# Patient Record
Sex: Female | Born: 1990 | Hispanic: No | Marital: Married | Smoking: Never smoker
Health system: Southern US, Community
[De-identification: ages and names within clinical notes are randomized; demographics above are authoritative.]

## PROBLEM LIST (undated history)

## (undated) DIAGNOSIS — Z227 Latent tuberculosis: Secondary | ICD-10-CM

## (undated) HISTORY — PX: PARTIAL HYMENECTOMY: SHX327

---
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
## 2019-04-10 NOTE — L&D Delivery Note (Signed)
Delivery Note At 3:52 AM a viable female was delivered via Vaginal, Spontaneous (Presentation: Right Occiput Anterior).  APGAR: 9, 10; weight pending.   Placenta status: Spontaneous, Intact.  Cord: 3 vessels with the following complications: None.  Cord pH: n/a  Anesthesia: Epidural Episiotomy: None Lacerations: 2nd degree;Perineal Suture Repair: 2.0 3.0 vicryl Est. Blood Loss (mL): 450  Mom to postpartum.  Baby to Couplet care / Skin to Skin.  Called to see patient.  Mom pushed to deliver a viable female infant.  The head followed by shoulders, which delivered without difficulty, and the rest of the body.  No nuchal cord noted.  Baby to mom's chest.  Cord clamped and cut after > 1 min delay.  No cord blood obtained.  Placenta delivered spontaneously, intact, with a 3-vessel cord.  Second degree perineal laceration repaired with 3-0 and 2-0 Vicryl in standard fashion.  All counts correct.  Hemostasis obtained with IV pitocin and fundal massage. EBL 450 mL.     Thomasene Mohair, MD 04/06/2020, 4:29 AM

## 2019-08-08 DIAGNOSIS — Z227 Latent tuberculosis: Secondary | ICD-10-CM

## 2019-08-08 HISTORY — DX: Latent tuberculosis: Z22.7

## 2019-10-07 LAB — OB RESULTS CONSOLE HIV ANTIBODY (ROUTINE TESTING): HIV: NONREACTIVE

## 2019-10-07 LAB — OB RESULTS CONSOLE RUBELLA ANTIBODY, IGM: Rubella: NON-IMMUNE/NOT IMMUNE

## 2019-10-07 LAB — OB RESULTS CONSOLE RPR: RPR: NONREACTIVE

## 2019-10-07 LAB — OB RESULTS CONSOLE HEPATITIS B SURFACE ANTIGEN: Hepatitis B Surface Ag: NEGATIVE

## 2020-01-29 ENCOUNTER — Observation Stay
Admission: EM | Admit: 2020-01-29 | Discharge: 2020-01-29 | Disposition: A | Discharge status: Home / Self Care | Payer: 59 | Attending: Obstetrics and Gynecology | Admitting: Obstetrics and Gynecology

## 2020-01-29 ENCOUNTER — Other Ambulatory Visit: Payer: Self-pay

## 2020-01-29 ENCOUNTER — Encounter: Payer: Self-pay | Admitting: *Deleted

## 2020-01-29 DIAGNOSIS — O418X10 Other specified disorders of amniotic fluid and membranes, first trimester, not applicable or unspecified: Secondary | ICD-10-CM | POA: Diagnosis not present

## 2020-01-29 DIAGNOSIS — Z3A28 28 weeks gestation of pregnancy: Secondary | ICD-10-CM

## 2020-01-29 DIAGNOSIS — Z3403 Encounter for supervision of normal first pregnancy, third trimester: Secondary | ICD-10-CM

## 2020-01-29 DIAGNOSIS — O99891 Other specified diseases and conditions complicating pregnancy: Secondary | ICD-10-CM | POA: Diagnosis not present

## 2020-01-29 DIAGNOSIS — Z227 Latent tuberculosis: Secondary | ICD-10-CM | POA: Diagnosis not present

## 2020-01-29 DIAGNOSIS — O4193X0 Disorder of amniotic fluid and membranes, unspecified, third trimester, not applicable or unspecified: Secondary | ICD-10-CM | POA: Diagnosis present

## 2020-01-29 LAB — RUPTURE OF MEMBRANE (ROM)PLUS: Rom Plus: NEGATIVE

## 2020-01-29 NOTE — Final Progress Note (Signed)
Physician Final Progress Note  Patient ID: Patricia Ashley MRN: [id number] DOB/AGE: 29/01/1991 29 y.o.  Admit date: 01/29/2020 Admitting provider: [name], CNM Discharge date: 01/29/2020   Admission Diagnoses:  1) intrauterine pregnancy at 103w5d  2) concern for ruptured membranes  Discharge Diagnoses:  1) intrauterine pregnancy at [redacted]w[redacted]d  2) concern for ruptured membranes - no evidence of ruptured membranes   History of Present Illness: The patient is a 29 y.o. female G1P0 at [redacted]w[redacted]d by LMP who presents for concern for rupture of membranes.  She had a big gush of fluid two midnights ago.  She has had no fluid leaking since then.  She denies any other vaginal symptoms, including; itching, burning, and irritation.  She notes +FM, no vaginal bleeding and no contractions.  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 She has had her prenatal care in [place].  She was seen by infectious disease who planned to treat her postpartum. She had negative chest x-ray.  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  She states that she is healing well from this.  She is Rh+, she passed her 1 hour glucose test: 125.  She had first trimester screening which was negative.  She is rubella non-immune.   Past Medical History:  Diagnosis Date  . Latent tuberculosis     Past Surgical History:  Procedure Laterality Date  . PARTIAL HYMENECTOMY      No current facility-administered medications on file prior to encounter.   Current Outpatient Medications on File Prior to Encounter  Medication Sig Dispense Refill  . Prenatal Vit-Fe Fumarate-FA (MULTIVITAMIN-PRENATAL) 27-0.8 MG TABS tablet Take 1 tablet by mouth daily at 12 noon.      No Known Allergies  Social History   Socioeconomic History  . Marital status: Married    Spouse name: Not on file  . Number of children: Not on file  . Years of education: Not on file  . Highest education  level: Not on file  Occupational History  . Not on file  Tobacco Use  . Smoking status: Never Smoker  . Smokeless tobacco: Never Used  Substance and Sexual Activity  . Alcohol use: Never  . Drug use: Never  . Sexual activity: Never  Other Topics Concern  . Not on file  Social History Narrative  . Not on file   Social Determinants of Health   Financial Resource Strain:   . Difficulty of Paying Living Expenses: Not on file  Food Insecurity:   . Worried About Programme researcher, broadcasting/film/video in the Last Year: Not on file  . Ran Out of Food in the Last Year: Not on file  Transportation Needs:   . Lack of Transportation (Medical): Not on file  . Lack of Transportation (Non-Medical): Not on file  Physical Activity:   . Days of Exercise per Week: Not on file  . Minutes of Exercise per Session: Not on file  Stress:   . Feeling of Stress : Not on file  Social Connections:   . Frequency of Communication with Friends and Family: Not on file  . Frequency of Social Gatherings with Friends and Family: Not on file  . Attends Religious Services: Not on file  . Active Member of Clubs or Organizations: Not on file  . Attends Banker Meetings: Not on file  . Marital Status: Not on file  Intimate Partner Violence:   . Fear of Current or Ex-Partner: Not on file  .  Emotionally Abused: Not on file  . Physically Abused: Not on file  . Sexually Abused: Not on file    Family History  Problem Relation Age of Onset  . Congestive Heart Failure Mother   . Diabetes Mother   . Hypertension Mother   . Diabetes Father   . Hypertension Father      Review of Systems  Constitutional: Negative.   HENT: Negative.   Eyes: Negative.   Respiratory: Negative.   Cardiovascular: Negative.   Gastrointestinal: Negative.   Genitourinary: Negative.   Musculoskeletal: Negative.   Skin: Negative.   Neurological: Negative.   Psychiatric/Behavioral: Negative.      Physical Exam: BP 111/68 (BP  Location: Left Arm)   Pulse (!) 108   Temp 98.1 F (36.7 C) (Oral)   Resp 18   LMP 07/12/2019   Physical Exam Constitutional:      General: She is not in acute distress.    Appearance: Normal appearance. She is well-developed.  Genitourinary:     Genitourinary Comments: deferred  HENT:     Head: Normocephalic and atraumatic.  Eyes:     General: No scleral icterus.    Conjunctiva/sclera: Conjunctivae normal.  Cardiovascular:     Rate and Rhythm: Normal rate and regular rhythm.     Heart sounds: No murmur heard.  No friction rub. No gallop.   Pulmonary:     Effort: Pulmonary effort is normal. No respiratory distress.     Breath sounds: Normal breath sounds. No wheezing or rales.  Abdominal:     General: Bowel sounds are normal. There is no distension.     Palpations: Abdomen is soft. There is mass (Gravid, NT).     Tenderness: There is no abdominal tenderness. There is no guarding or rebound.  Musculoskeletal:        General: Normal range of motion.     Cervical back: Normal range of motion and neck supple.  Neurological:     General: No focal deficit present.     Mental Status: She is alert and oriented to person, place, and time.     Cranial Nerves: No cranial nerve deficit.  Skin:    General: Skin is warm and dry.     Findings: No erythema.  Psychiatric:        Mood and Affect: Mood normal.        Behavior: Behavior normal.        Judgment: Judgment normal.     Consults: None  Significant Findings/ Diagnostic Studies:  ROM+: NEGATIVE  Procedures:  NST: Baseline FHR: 145 beats/min Variability: moderate Accelerations: present Decelerations: absent Tocometry: rare/absent  Interpretation:  INDICATIONS: rule out uterine contractions RESULTS:  A NST procedure was performed with FHR monitoring and a normal baseline established, appropriate time of 20-40 minutes of evaluation, and accels >2 seen w 15x15 characteristics.  Results show a REACTIVE NST.    Hospital  Course: The patient was admitted to Labor and Delivery Triage for observation. She had normal vital signs. She had a reassuring fetal tracing, given gestational age.  She had a negative ROM+ test and a bedside u/s showed normal amniotic fluid with a singleton gestation, movement present, in breech presentation with a variable lie.  Her history of leaking fluid was discussed with her and she was reassured. She had no further concerns. So, she was discharged in stable condition.    Discharge Condition: stable  Disposition: Discharge disposition: 01-Home or Self Care       Diet: Regular  diet  Discharge Activity: Activity as tolerated   Allergies as of 01/29/2020   No Known Allergies     Medication List    TAKE these medications   multivitamin-prenatal 27-0.8 MG Tabs tablet Take 1 tablet by mouth daily at 12 noon.       Follow-up Information    [facility] Follow up.   Contact information: [address] [place] [id number] [phone]              Total time spent taking care of this patient: 45 minutes, reviewing outside pregnancy records, direct patient care.   Signed: Thomasene Mohair, MD  01/29/2020, 6:01 PM

## 2020-01-29 NOTE — OB Triage Note (Signed)
Patient arrived to ER for questionable ROM. Pt stats she was was on the way to the bathroom to void two 0000 ago and leaked clear fluid that did not smell like urn. Pt had not had any further leaking since. Pt states baby is moving well, denies pain, contractions, bleeding or any other concerns. [place] and had appointment scheduled for next Friday with [facility].

## 2020-01-29 NOTE — Progress Notes (Signed)
Patient given discharge instructions by RN and MD. Reviewed labor precautions with patient and patient and support person verbalized understanding. Patient discharged home with support person in stable condition.

## 2020-01-29 NOTE — Discharge Summary (Signed)
See Final Progress Note 

## 2020-01-29 NOTE — Discharge Instructions (Signed)
Drink plenty of fluid. Call your provider for any other concerns. Keep your scheduled appointment for Prenatal care.

## 2020-02-05 ENCOUNTER — Ambulatory Visit (INDEPENDENT_AMBULATORY_CARE_PROVIDER_SITE_OTHER): Payer: 59 | Admitting: Obstetrics & Gynecology

## 2020-02-05 ENCOUNTER — Other Ambulatory Visit: Payer: Self-pay

## 2020-02-05 ENCOUNTER — Encounter: Payer: Self-pay | Admitting: Obstetrics & Gynecology

## 2020-02-05 VITALS — BP 120/80 | Ht 61.0 in | Wt 170.0 lb

## 2020-02-05 DIAGNOSIS — O99213 Obesity complicating pregnancy, third trimester: Secondary | ICD-10-CM

## 2020-02-05 DIAGNOSIS — Z3A29 29 weeks gestation of pregnancy: Secondary | ICD-10-CM

## 2020-02-05 DIAGNOSIS — O0990 Supervision of high risk pregnancy, unspecified, unspecified trimester: Secondary | ICD-10-CM | POA: Insufficient documentation

## 2020-02-05 DIAGNOSIS — Z3493 Encounter for supervision of normal pregnancy, unspecified, third trimester: Secondary | ICD-10-CM

## 2020-02-05 LAB — POCT URINALYSIS DIPSTICK OB
Glucose, UA: NEGATIVE
POC,PROTEIN,UA: NEGATIVE

## 2020-02-05 NOTE — Addendum Note (Signed)
Addended by: Cornelius Moras D on: 02/05/2020 09:24 AM   Modules accepted: Orders

## 2020-02-05 NOTE — Patient Instructions (Signed)
Thank you for choosing [facility]. As part of our ongoing efforts to improve patient experience, we would appreciate your feedback. Please fill out the short survey that you will receive by mail or MyChart. Your opinion is important to us! -Dr Mayerly Kaman   Third Trimester of Pregnancy The third trimester is from week 28 through week 40 (months 7 through 9). The third trimester is a time when the unborn baby (fetus) is growing rapidly. At the end of the ninth month, the fetus is about 20 inches in length and weighs 6-10 pounds. Body changes during your third trimester Your body will continue to go through many changes during pregnancy. The changes vary from woman to woman. During the third trimester:  Your weight will continue to increase. You can expect to gain 25-35 pounds (11-16 kg) by the end of the pregnancy.  You may begin to get stretch marks on your hips, abdomen, and breasts.  You may urinate more often because the fetus is moving lower into your pelvis and pressing on your bladder.  You may develop or continue to have heartburn. This is caused by increased hormones that slow down muscles in the digestive tract.  You may develop or continue to have constipation because increased hormones slow digestion and cause the muscles that push waste through your intestines to relax.  You may develop hemorrhoids. These are swollen veins (varicose veins) in the rectum that can itch or be painful.  You may develop swollen, bulging veins (varicose veins) in your legs.  You may have increased body aches in the pelvis, back, or thighs. This is due to weight gain and increased hormones that are relaxing your joints.  You may have changes in your hair. These can include thickening of your hair, rapid growth, and changes in texture. Some women also have hair loss during or after pregnancy, or hair that feels dry or thin. Your hair will most likely return to normal after your baby is born.  Your  breasts will continue to grow and they will continue to become tender. A yellow fluid (colostrum) may leak from your breasts. This is the first milk you are producing for your baby.  Your belly button may stick out.  You may notice more swelling in your hands, face, or ankles.  You may have increased tingling or numbness in your hands, arms, and legs. The skin on your belly may also feel numb.  You may feel short of breath because of your expanding uterus.  You may have more problems sleeping. This can be caused by the size of your belly, increased need to urinate, and an increase in your body's metabolism.  You may notice the fetus "dropping," or moving lower in your abdomen (lightening).  You may have increased vaginal discharge.  You may notice your joints feel loose and you may have pain around your pelvic bone. What to expect at prenatal visits You will have prenatal exams every 2 weeks until week 36. Then you will have weekly prenatal exams. During a routine prenatal visit:  You will be weighed to make sure you and the baby are growing normally.  Your blood pressure will be taken.  Your abdomen will be measured to track your baby's growth.  The fetal heartbeat will be listened to.  Any test results from the previous visit will be discussed.  You may have a cervical check near your due date to see if your cervix has softened or thinned (effaced).  You will be   tested for Group B streptococcus. This happens between 35 and 37 weeks. Your health care provider may ask you:  What your birth plan is.  How you are feeling.  If you are feeling the baby move.  If you have had any abnormal symptoms, such as leaking fluid, bleeding, severe headaches, or abdominal cramping.  If you are using any tobacco products, including cigarettes, chewing tobacco, and electronic cigarettes.  If you have any questions. Other tests or screenings that may be performed during your third  trimester include:  Blood tests that check for low iron levels (anemia).  Fetal testing to check the health, activity level, and growth of the fetus. Testing is done if you have certain medical conditions or if there are problems during the pregnancy.  Nonstress test (NST). This test checks the health of your baby to make sure there are no signs of problems, such as the baby not getting enough oxygen. During this test, a belt is placed around your belly. The baby is made to move, and its heart rate is monitored during movement. What is false labor? False labor is a condition in which you feel small, irregular tightenings of the muscles in the womb (contractions) that usually go away with rest, changing position, or drinking water. These are called Braxton Hicks contractions. Contractions may last for hours, days, or even weeks before true labor sets in. If contractions come at regular intervals, become more frequent, increase in intensity, or become painful, you should see your health care provider. What are the signs of labor?  Abdominal cramps.  Regular contractions that start at 10 minutes apart and become stronger and more frequent with time.  Contractions that start on the top of the uterus and spread down to the lower abdomen and back.  Increased pelvic pressure and dull back pain.  A watery or bloody mucus discharge that comes from the vagina.  Leaking of amniotic fluid. This is also known as your "water breaking." It could be a slow trickle or a gush. Let your health care provider know if it has a color or strange odor. If you have any of these signs, call your health care provider right away, even if it is before your due date. Follow these instructions at home: Medicines  Follow your health care provider's instructions regarding medicine use. Specific medicines may be either safe or unsafe to take during pregnancy.  Take a prenatal vitamin that contains at least 600 micrograms  (mcg) of folic acid.  If you develop constipation, try taking a stool softener if your health care provider approves. Eating and drinking   Eat a balanced diet that includes fresh fruits and vegetables, whole grains, good sources of protein such as meat, eggs, or tofu, and low-fat dairy. Your health care provider will help you determine the amount of weight gain that is right for you.  Avoid raw meat and uncooked cheese. These carry germs that can cause birth defects in the baby.  If you have low calcium intake from food, talk to your health care provider about whether you should take a daily calcium supplement.  Eat four or five small meals rather than three large meals a day.  Limit foods that are high in fat and processed sugars, such as fried and sweet foods.  To prevent constipation: ? Drink enough fluid to keep your urine clear or pale yellow. ? Eat foods that are high in fiber, such as fresh fruits and vegetables, whole grains, and beans. Activity    Exercise only as directed by your health care provider. Most women can continue their usual exercise routine during pregnancy. Try to exercise for 30 minutes at least 5 days a week. Stop exercising if you experience uterine contractions.  Avoid heavy lifting.  Do not exercise in extreme heat or humidity, or at high altitudes.  Wear low-heel, comfortable shoes.  Practice good posture.  You may continue to have sex unless your health care provider tells you otherwise. Relieving pain and discomfort  Take frequent breaks and rest with your legs elevated if you have leg cramps or low back pain.  Take warm sitz baths to soothe any pain or discomfort caused by hemorrhoids. Use hemorrhoid cream if your health care provider approves.  Wear a good support bra to prevent discomfort from breast tenderness.  If you develop varicose veins: ? Wear support pantyhose or compression stockings as told by your healthcare provider. ? Elevate  your feet for 15 minutes, 3-4 times a day. Prenatal care  Write down your questions. Take them to your prenatal visits.  Keep all your prenatal visits as told by your health care provider. This is important. Safety  Wear your seat belt at all times when driving.  Make a list of emergency phone numbers, including numbers for family, friends, the hospital, and police and fire departments. General instructions  Avoid cat litter boxes and soil used by cats. These carry germs that can cause birth defects in the baby. If you have a cat, ask someone to clean the litter box for you.  Do not travel far distances unless it is absolutely necessary and only with the approval of your health care provider.  Do not use hot tubs, steam rooms, or saunas.  Do not drink alcohol.  Do not use any products that contain nicotine or tobacco, such as cigarettes and e-cigarettes. If you need help quitting, ask your health care provider.  Do not use any medicinal herbs or unprescribed drugs. These chemicals affect the formation and growth of the baby.  Do not douche or use tampons or scented sanitary pads.  Do not cross your legs for long periods of time.  To prepare for the arrival of your baby: ? Take prenatal classes to understand, practice, and ask questions about labor and delivery. ? Make a trial run to the hospital. ? Visit the hospital and tour the maternity area. ? Arrange for maternity or paternity leave through employers. ? Arrange for family and friends to take care of pets while you are in the hospital. ? Purchase a rear-facing car seat and make sure you know how to install it in your car. ? Pack your hospital bag. ? Prepare the baby's nursery. Make sure to remove all pillows and stuffed animals from the baby's crib to prevent suffocation.  Visit your dentist if you have not gone during your pregnancy. Use a soft toothbrush to brush your teeth and be gentle when you floss. Contact a health  care provider if:  You are unsure if you are in labor or if your water has broken.  You become dizzy.  You have mild pelvic cramps, pelvic pressure, or nagging pain in your abdominal area.  You have lower back pain.  You have persistent nausea, vomiting, or diarrhea.  You have an unusual or bad smelling vaginal discharge.  You have pain when you urinate. Get help right away if:  Your water breaks before 37 weeks.  You have regular contractions less than 5 minutes apart before   37 weeks.  You have a fever.  You are leaking fluid from your vagina.  You have spotting or bleeding from your vagina.  You have severe abdominal pain or cramping.  You have rapid weight loss or weight gain.  You have shortness of breath with chest pain.  You notice sudden or extreme swelling of your face, hands, ankles, feet, or legs.  Your baby makes fewer than 10 movements in 2 hours.  You have severe headaches that do not go away when you take medicine.  You have vision changes. Summary  The third trimester is from week 28 through week 40, months 7 through 9. The third trimester is a time when the unborn baby (fetus) is growing rapidly.  During the third trimester, your discomfort may increase as you and your baby continue to gain weight. You may have abdominal, leg, and back pain, sleeping problems, and an increased need to urinate.  During the third trimester your breasts will keep growing and they will continue to become tender. A yellow fluid (colostrum) may leak from your breasts. This is the first milk you are producing for your baby.  False labor is a condition in which you feel small, irregular tightenings of the muscles in the womb (contractions) that eventually go away. These are called Braxton Hicks contractions. Contractions may last for hours, days, or even weeks before true labor sets in.  Signs of labor can include: abdominal cramps; regular contractions that start at 10  minutes apart and become stronger and more frequent with time; watery or bloody mucus discharge that comes from the vagina; increased pelvic pressure and dull back pain; and leaking of amniotic fluid. This information is not intended to replace advice given to you by your health care provider. Make sure you discuss any questions you have with your health care provider. Document Revised: 07/17/2018 Document Reviewed: 05/01/2016 [employer] Patient Education  2020 [employer]  

## 2020-02-05 NOTE — Progress Notes (Signed)
02/05/2020   Chief Complaint: Missed period  Transfer of Care Patient: YES, from [place], records reviewed  History of Present Illness: Ms. Manke is a 29 y.o. G1P0 [redacted]w[redacted]d based on Patient's last menstrual period was 07/12/2019. with an Estimated Date of Delivery: 04/17/20, with the above CC.   She has received prenatal care, with obesity (BMI 30-35) as a risk factor as well as hymenectomy 2 mos ago in preparation.  Normal labs and [place] reviewed.  ROS: A 12-point review of systems was performed and negative, except as stated in the above HPI.  OBGYN History: As per HPI. OB History  Gravida Para Term Preterm AB Living  1            SAB TAB Ectopic Multiple Live Births               # Outcome Date GA Lbr Len/2nd Weight Sex Delivery Anes PTL Lv  1 Current             Any issues with any prior pregnancies: not applicable Any prior children are healthy, doing well, without any problems or issues: not applicable History of pap smears: Yes. Last pap smear Unsure, not in records. History of STIs: No   Past Medical History: Past Medical History:  Diagnosis Date  . Latent tuberculosis     Past Surgical History: Past Surgical History:  Procedure Laterality Date  . PARTIAL HYMENECTOMY      Family History:  Family History  Problem Relation Age of Onset  . Congestive Heart Failure Mother   . Diabetes Mother   . Hypertension Mother   . Diabetes Father   . Hypertension Father    She denies any female cancers, bleeding or blood clotting disorders.  She denies any history of mental retardation, birth defects or genetic disorders in her or the FOB's history  Social History:  Social History   Socioeconomic History  . Marital status: Married    Spouse name: Not on file  . Number of children: Not on file  . Years of education: Not on file  . Highest education level: Not on file  Occupational History  . Not on file  Tobacco Use  . Smoking status: Never Smoker  . Smokeless  tobacco: Never Used  Vaping Use  . Vaping Use: Never used  Substance and Sexual Activity  . Alcohol use: Never  . Drug use: Never  . Sexual activity: Never  Other Topics Concern  . Not on file  Social History Narrative  . Not on file   Social Determinants of Health   Financial Resource Strain:   . Difficulty of Paying Living Expenses: Not on file  Food Insecurity:   . Worried About Programme researcher, broadcasting/film/video in the Last Year: Not on file  . Ran Out of Food in the Last Year: Not on file  Transportation Needs:   . Lack of Transportation (Medical): Not on file  . Lack of Transportation (Non-Medical): Not on file  Physical Activity:   . Days of Exercise per Week: Not on file  . Minutes of Exercise per Session: Not on file  Stress:   . Feeling of Stress : Not on file  Social Connections:   . Frequency of Communication with Friends and Family: Not on file  . Frequency of Social Gatherings with Friends and Family: Not on file  . Attends Religious Services: Not on file  . Active Member of Clubs or Organizations: Not on file  . Attends Club or  Organization Meetings: Not on file  . Marital Status: Not on file  Intimate Partner Violence:   . Fear of Current or Ex-Partner: Not on file  . Emotionally Abused: Not on file  . Physically Abused: Not on file  . Sexually Abused: Not on file   Any pets in the household: no   Allergy: No Known Allergies  Current Outpatient Medications:  Current Outpatient Medications:  .  Prenatal Vit-Fe Fumarate-FA (MULTIVITAMIN-PRENATAL) 27-0.8 MG TABS tablet, Take 1 tablet by mouth daily at 12 noon., Disp: , Rfl:    Physical Exam:   BP 120/80   Ht 5\' 1"  (1.549 m)   Wt 170 lb (77.1 kg)   LMP 07/12/2019   BMI 32.12 kg/m  Body mass index is 32.12 kg/m. Constitutional: Well nourished, well developed female in no acute distress.  Neck:  Supple, normal appearance, and no thyromegaly  Cardiovascular: S1, S2 normal, no murmur, rub or gallop, regular  rate and rhythm Respiratory:  Clear to auscultation bilateral. Normal respiratory effort Abdomen: positive bowel sounds and no masses, hernias; diffusely non tender to palpation, non distended FHT 140s FH 29 cm Neuro/Psych:  Normal mood and affect.  Skin:  Warm and dry.  Lymphatic:  No inguinal lymphadenopathy.   Assessment: Ms. Mccarey is a 29 y.o. G1P0 [redacted]w[redacted]d based on Patient's last menstrual period was 07/12/2019. with an Estimated Date of Delivery: 04/17/20,  for prenatal care.  Plan:  1) Avoid alcoholic beverages. 2) Patient encouraged not to smoke.  3) Discontinue the use of all non-medicinal drugs and chemicals.  4) Take prenatal vitamins daily.  5) Seatbelt use advised 6) Nutrition, food safety (fish, cheese advisories, and high nitrite foods) and exercise discussed. 7) Hospital and practice style delivering at [facility] discussed  8) Patient is asked about travel to areas at risk for the Zika virus, and counseled to avoid travel and exposure to mosquitoes or sexual partners who may have themselves been exposed to the virus. Testing is discussed, and will be ordered as appropriate.  9) Childbirth classes at [facility] advised 10) [facility] for growth nv 11) No varicella in labs, plan to check next blood draw, perhaps on L&D when laboring 12) No PAP in records, plan PP 13) Third trimester and L&D expectations d/w pt.  Problem list reviewed and updated.  [place], MD, [facility], [facility] 02/05/2020  9:10 AM

## 2020-02-19 ENCOUNTER — Ambulatory Visit: Payer: 59

## 2020-02-19 ENCOUNTER — Encounter: Payer: 59 | Admitting: Obstetrics and Gynecology

## 2020-02-24 ENCOUNTER — Ambulatory Visit (INDEPENDENT_AMBULATORY_CARE_PROVIDER_SITE_OTHER): Payer: 59 | Admitting: Obstetrics and Gynecology

## 2020-02-24 ENCOUNTER — Ambulatory Visit: Payer: 59

## 2020-02-24 ENCOUNTER — Other Ambulatory Visit: Payer: Self-pay

## 2020-02-24 ENCOUNTER — Encounter: Payer: Self-pay | Admitting: Obstetrics and Gynecology

## 2020-02-24 VITALS — BP 110/70 | Wt 173.0 lb

## 2020-02-24 DIAGNOSIS — O0990 Supervision of high risk pregnancy, unspecified, unspecified trimester: Secondary | ICD-10-CM

## 2020-02-24 DIAGNOSIS — Z23 Encounter for immunization: Secondary | ICD-10-CM

## 2020-02-24 DIAGNOSIS — O26843 Uterine size-date discrepancy, third trimester: Secondary | ICD-10-CM

## 2020-02-24 DIAGNOSIS — Z227 Latent tuberculosis: Secondary | ICD-10-CM

## 2020-02-24 DIAGNOSIS — O99213 Obesity complicating pregnancy, third trimester: Secondary | ICD-10-CM

## 2020-02-24 DIAGNOSIS — Z3493 Encounter for supervision of normal pregnancy, unspecified, third trimester: Secondary | ICD-10-CM

## 2020-02-24 LAB — POCT URINALYSIS DIPSTICK OB
Glucose, UA: NEGATIVE
POC,PROTEIN,UA: NEGATIVE

## 2020-02-24 NOTE — Progress Notes (Signed)
Pt states for [redacted]w[redacted]d ROB visit. Pt want to know when she'll be getting an [place]. Pt states she has a cough that she has been dealing with for a month or so. Pt states she's taking Guefensin 200mg  ([employer] ) Brand.

## 2020-02-24 NOTE — Patient Instructions (Addendum)
Third Trimester of Pregnancy The third trimester is from week 28 through week 40 (months 7 through 9). The third trimester is a time when the unborn baby (fetus) is growing rapidly. At the end of the ninth month, the fetus is about 20 inches in length and weighs 6-10 pounds. Body changes during your third trimester Your body will continue to go through many changes during pregnancy. The changes vary from woman to woman. During the third trimester:  Your weight will continue to increase. You can expect to gain 25-35 pounds (11-16 kg) by the end of the pregnancy.  You may begin to get stretch marks on your hips, abdomen, and breasts.  You may urinate more often because the fetus is moving lower into your pelvis and pressing on your bladder.  You may develop or continue to have heartburn. This is caused by increased hormones that slow down muscles in the digestive tract.  You may develop or continue to have constipation because increased hormones slow digestion and cause the muscles that push waste through your intestines to relax.  You may develop hemorrhoids. These are swollen veins (varicose veins) in the rectum that can itch or be painful.  You may develop swollen, bulging veins (varicose veins) in your legs.  You may have increased body aches in the pelvis, back, or thighs. This is due to weight gain and increased hormones that are relaxing your joints.  You may have changes in your hair. These can include thickening of your hair, rapid growth, and changes in texture. Some women also have hair loss during or after pregnancy, or hair that feels dry or thin. Your hair will most likely return to normal after your baby is born.  Your breasts will continue to grow and they will continue to become tender. A yellow fluid (colostrum) may leak from your breasts. This is the first milk you are producing for your baby.  Your belly button may stick out.  You may notice more swelling in your hands,  face, or ankles.  You may have increased tingling or numbness in your hands, arms, and legs. The skin on your belly may also feel numb.  You may feel short of breath because of your expanding uterus.  You may have more problems sleeping. This can be caused by the size of your belly, increased need to urinate, and an increase in your body's metabolism.  You may notice the fetus "dropping," or moving lower in your abdomen (lightening).  You may have increased vaginal discharge.  You may notice your joints feel loose and you may have pain around your pelvic bone. What to expect at prenatal visits You will have prenatal exams every 2 weeks until week 36. Then you will have weekly prenatal exams. During a routine prenatal visit:  You will be weighed to make sure you and the baby are growing normally.  Your blood pressure will be taken.  Your abdomen will be measured to track your baby's growth.  The fetal heartbeat will be listened to.  Any test results from the previous visit will be discussed.  You may have a cervical check near your due date to see if your cervix has softened or thinned (effaced).  You will be tested for Group B streptococcus. This happens between 35 and 37 weeks. Your health care provider may ask you:  What your birth plan is.  How you are feeling.  If you are feeling the baby move.  If you have had any abnormal   symptoms, such as leaking fluid, bleeding, severe headaches, or abdominal cramping.  If you are using any tobacco products, including cigarettes, chewing tobacco, and electronic cigarettes.  If you have any questions. Other tests or screenings that may be performed during your third trimester include:  Blood tests that check for low iron levels (anemia).  Fetal testing to check the health, activity level, and growth of the fetus. Testing is done if you have certain medical conditions or if there are problems during the pregnancy.  Nonstress test  (NST). This test checks the health of your baby to make sure there are no signs of problems, such as the baby not getting enough oxygen. During this test, a belt is placed around your belly. The baby is made to move, and its heart rate is monitored during movement. What is false labor? False labor is a condition in which you feel small, irregular tightenings of the muscles in the womb (contractions) that usually go away with rest, changing position, or drinking water. These are called Braxton Hicks contractions. Contractions may last for hours, days, or even weeks before true labor sets in. If contractions come at regular intervals, become more frequent, increase in intensity, or become painful, you should see your health care provider. What are the signs of labor?  Abdominal cramps.  Regular contractions that start at 10 minutes apart and become stronger and more frequent with time.  Contractions that start on the top of the uterus and spread down to the lower abdomen and back.  Increased pelvic pressure and dull back pain.  A watery or bloody mucus discharge that comes from the vagina.  Leaking of amniotic fluid. This is also known as your "water breaking." It could be a slow trickle or a gush. Let your health care provider know if it has a color or strange odor. If you have any of these signs, call your health care provider right away, even if it is before your due date. Follow these instructions at home: Medicines  Follow your health care provider's instructions regarding medicine use. Specific medicines may be either safe or unsafe to take during pregnancy.  Take a prenatal vitamin that contains at least 600 micrograms (mcg) of folic acid.  If you develop constipation, try taking a stool softener if your health care provider approves. Eating and drinking   Eat a balanced diet that includes fresh fruits and vegetables, whole grains, good sources of protein such as meat, eggs, or tofu,  and low-fat dairy. Your health care provider will help you determine the amount of weight gain that is right for you.  Avoid raw meat and uncooked cheese. These carry germs that can cause birth defects in the baby.  If you have low calcium intake from food, talk to your health care provider about whether you should take a daily calcium supplement.  Eat four or five small meals rather than three large meals a day.  Limit foods that are high in fat and processed sugars, such as fried and sweet foods.  To prevent constipation: ? Drink enough fluid to keep your urine clear or pale yellow. ? Eat foods that are high in fiber, such as fresh fruits and vegetables, whole grains, and beans. Activity  Exercise only as directed by your health care provider. Most women can continue their usual exercise routine during pregnancy. Try to exercise for 30 minutes at least 5 days a week. Stop exercising if you experience uterine contractions.  Avoid heavy lifting.  Do   not exercise in extreme heat or humidity, or at high altitudes.  Wear low-heel, comfortable shoes.  Practice good posture.  You may continue to have sex unless your health care provider tells you otherwise. Relieving pain and discomfort  Take frequent breaks and rest with your legs elevated if you have leg cramps or low back pain.  Take warm sitz baths to soothe any pain or discomfort caused by hemorrhoids. Use hemorrhoid cream if your health care provider approves.  Wear a good support bra to prevent discomfort from breast tenderness.  If you develop varicose veins: ? Wear support pantyhose or compression stockings as told by your healthcare provider. ? Elevate your feet for 15 minutes, 3-4 times a day. Prenatal care  Write down your questions. Take them to your prenatal visits.  Keep all your prenatal visits as told by your health care provider. This is important. Safety  Wear your seat belt at all times when driving.  Make  a list of emergency phone numbers, including numbers for family, friends, the hospital, and police and fire departments. General instructions  Avoid cat litter boxes and soil used by cats. These carry germs that can cause birth defects in the baby. If you have a cat, ask someone to clean the litter box for you.  Do not travel far distances unless it is absolutely necessary and only with the approval of your health care provider.  Do not use hot tubs, steam rooms, or saunas.  Do not drink alcohol.  Do not use any products that contain nicotine or tobacco, such as cigarettes and e-cigarettes. If you need help quitting, ask your health care provider.  Do not use any medicinal herbs or unprescribed drugs. These chemicals affect the formation and growth of the baby.  Do not douche or use tampons or scented sanitary pads.  Do not cross your legs for long periods of time.  To prepare for the arrival of your baby: ? Take prenatal classes to understand, practice, and ask questions about labor and delivery. ? Make a trial run to the hospital. ? Visit the hospital and tour the maternity area. ? Arrange for maternity or paternity leave through employers. ? Arrange for family and friends to take care of pets while you are in the hospital. ? Purchase a rear-facing car seat and make sure you know how to install it in your car. ? Pack your hospital bag. ? Prepare the baby's nursery. Make sure to remove all pillows and stuffed animals from the baby's crib to prevent suffocation.  Visit your dentist if you have not gone during your pregnancy. Use a soft toothbrush to brush your teeth and be gentle when you floss. Contact a health care provider if:  You are unsure if you are in labor or if your water has broken.  You become dizzy.  You have mild pelvic cramps, pelvic pressure, or nagging pain in your abdominal area.  You have lower back pain.  You have persistent nausea, vomiting, or  diarrhea.  You have an unusual or bad smelling vaginal discharge.  You have pain when you urinate. Get help right away if:  Your water breaks before 37 weeks.  You have regular contractions less than 5 minutes apart before 37 weeks.  You have a fever.  You are leaking fluid from your vagina.  You have spotting or bleeding from your vagina.  You have severe abdominal pain or cramping.  You have rapid weight loss or weight gain.  You have   shortness of breath with chest pain.  You notice sudden or extreme swelling of your face, hands, ankles, feet, or legs.  Your baby makes fewer than 10 movements in 2 hours.  You have severe headaches that do not go away when you take medicine.  You have vision changes. Summary  The third trimester is from week 28 through week 40, months 7 through 9. The third trimester is a time when the unborn baby (fetus) is growing rapidly.  During the third trimester, your discomfort may increase as you and your baby continue to gain weight. You may have abdominal, leg, and back pain, sleeping problems, and an increased need to urinate.  During the third trimester your breasts will keep growing and they will continue to become tender. A yellow fluid (colostrum) may leak from your breasts. This is the first milk you are producing for your baby.  False labor is a condition in which you feel small, irregular tightenings of the muscles in the womb (contractions) that eventually go away. These are called Braxton Hicks contractions. Contractions may last for hours, days, or even weeks before true labor sets in.  Signs of labor can include: abdominal cramps; regular contractions that start at 10 minutes apart and become stronger and more frequent with time; watery or bloody mucus discharge that comes from the vagina; increased pelvic pressure and dull back pain; and leaking of amniotic fluid. This information is not intended to replace advice given to you by your  health care provider. Make sure you discuss any questions you have with your health care provider. Document Revised: 07/17/2018 Document Reviewed: 05/01/2016 Elsevier Patient Education  2020 [employer]   Tuberculosis Tuberculosis (TB) is an infection that usually affects the lungs but can affect other parts of the body. It is caused by bacteria. There are two forms of TB:  Active TB, also called TB disease. This means that you have TB symptoms and your infection can spread to another person (you are contagious).  Latent TB. This means that you do not have any symptoms of TB and you are not contagious. Latent TB can turn into active TB, so it is important to get treatment no matter which form of TB you have. What are the causes? TB is caused by bacteria called Mycobacterium tuberculosis. You can catch the bacteria by breathing in droplets from a cough or sneeze from someone who has active TB. What increases the risk? You are more likely to develop TB if you:  Have HIV (human immunodeficiency virus) or AIDS (acquired immunodeficiency syndrome).  Have diabetes (diabetes mellitus).  Are older. Infants are also more likely to get TB.  Use illegal drugs.  Use tobacco.  Live in or travel to areas that have high rates of TB, such as: ? [place]. ? [place] ? [place]. ? [place] ? [place]. ? [place]. ? [place].  Live or work in areas of overcrowding or poor airflow (ventilation), including: ? Health care facilities. ? Residential care facilities, such as an assisted living facility. ? Refugee camps or shelters for people who are experiencing homelessness. What are the signs or symptoms? Symptoms of this condition include:  Coughing up blood, mucus from the lungs (sputum), or both.  A cough that lasts three weeks or longer.  Chest pain, or pain while breathing or coughing.  Unexplained weight loss.  Fatigue and weakness.  Fever.  Sweating.  Chills.  Loss of  appetite. How is this diagnosed? This condition may be diagnosed based on  a physical exam, your symptoms, and your medical history. You may have chest X-rays done. You may also have one or more of the following samples taken and tested for bacteria:  Skin.  Blood.  Sputum.  Urine. How is this treated? Both latent and active TB are treated with antibiotic medicine. You may need to take antibiotics for up to 6-9 months. Follow these instructions at home:     Medicines  Take over-the-counter and prescription medicines only as told by your health care provider.  Take your antibiotic medicine as told by your health care provider. Do not stop taking the antibiotic even if you start to feel better. Activity  Rest as needed. Ask your health care provider what activities are safe for you.  Do not go back to work or school until your health care provider approves.  Avoid close contact with others (especially infants and older people) until your health care provider says that you are no longer contagious. General instructions  Tell your health care provider about all the people you live with or have close contact with. Those people may need to be tested for TB.  Do not use any products that contain nicotine or tobacco, such as cigarettes and e-cigarettes. If you need help quitting, ask your health care provider.  Cover your mouth and nose when you cough or sneeze. Dispose of used tissues as directed by your health care provider.  Wash your hands often with soap and water. If soap and water are not available, use hand sanitizer.  Keep all follow-up visits as told by your health care provider. This is important. Contact a health care provider if:  You have new symptoms.  You lose your appetite.  You feel nauseous or you vomit.  Your urine is dark yellow.  Your skin or the white part of your eyes turns a yellowish color (jaundice).  Your symptoms get worse or do not go away with  treatment.  You have a fever. Get help right away if:  You have chest pain.  You cough up blood.  You have trouble breathing or feel short of breath.  You have a headache or neck stiffness. Summary  Tuberculosis (TB) is an infection that usually affects the lungs but can affect other parts of the body. It is caused by bacteria.  The two forms of TB are active TB and latent TB. If you have active TB, your infection can spread to another person (you are contagious).  Latent TB can turn into active TB, so it is important to get treatment no matter which form of TB you have.  TB is treated with antibiotic medicine. You may need to take antibiotics for up to 6-9 months. This information is not intended to replace advice given to you by your health care provider. Make sure you discuss any questions you have with your health care provider. Document Revised: 06/25/2017 Document Reviewed: 10/12/2016 Elsevier Patient Education  2020 [employer].

## 2020-02-24 NOTE — Progress Notes (Signed)
Routine Prenatal Care Visit  Subjective  Patricia Ashley is a 30 y.o. G1P0 at [redacted]w[redacted]d being seen today for ongoing prenatal care.  She is currently monitored for the following issues for this high-risk pregnancy and has Labor and delivery, indication for care; Encounter for supervision of normal first pregnancy in third trimester; [redacted] weeks gestation of pregnancy; Supervision of high risk pregnancy, antepartum; and Obesity affecting pregnancy in third trimester on their problem list.  ----------------------------------------------------------------------------------- Patient reports no complaints.   Contractions: Not present. Vag. Bleeding: None.  Movement: Present. Denies leaking of fluid.  ----------------------------------------------------------------------------------- The following portions of the patient's history were reviewed and updated as appropriate: allergies, current medications, past family history, past medical history, past social history, past surgical history and problem list. Problem list updated.   Objective  Blood pressure 110/70, weight 173 lb (78.5 kg), last menstrual period 07/12/2019. Pregravid weight 127 lb (57.6 kg) Total Weight Gain 46 lb (20.9 kg) Urinalysis:      Fetal Status: Fetal Heart Rate (bpm): 160 Fundal Height: 32 cm Movement: Present     General:  Alert, oriented and cooperative. Patient is in no acute distress.  Skin: Skin is warm and dry. No rash noted.   Cardiovascular: Normal heart rate noted  Respiratory: Normal respiratory effort, no problems with respiration noted  Abdomen: Soft, gravid, appropriate for gestational age. Pain/Pressure: Absent     Pelvic:  Cervical exam deferred        Extremities: Normal range of motion.  Edema: None  Mental Status: Normal mood and affect. Normal behavior. Normal judgment and thought content.     Assessment   29 y.o. G1P0 at [redacted]w[redacted]d by  04/17/2020, by Last Menstrual Period presenting for routine prenatal  visit  Plan   G1 Problems (from 01/29/20 to present)    Problem Noted Resolved   Supervision of high risk pregnancy, antepartum 02/05/2020 by Nadara Mustard, MD No   Overview Addendum 02/24/2020  2:59 PM by Natale Milch, MD    Clinic [facility] Prenatal Labs  Dating LMP Blood type:   - B+  Genetic Screen 1 Screen:  NEG Antibody: Neg  Anatomic [place] in [place], nml Rubella:   - NonImmune  Varicella:   GTT Third trimester:125  RPR:   - neg Trep test  Rhogam n/a HBsAg:   - NR  TDaP vaccine    02/24/2020           Flu Shot: 01/14/2020 HIV:   - NR  Baby Food   Breast                             GBS:   Contraception  Undecided Pap: will need postpartum  CBB  No   CS/VBAC n/a   Support Person Husband     High Risk Pregnancy Diagnoses Microperforate hymen, 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 Encouraged to maintain current weight as much as feasible.           Previous Version   Encounter for supervision of normal first pregnancy in third trimester 01/29/2020 by Conard Novak, MD No   [redacted] weeks gestation of pregnancy 01/29/2020 by Conard Novak, MD No      Will schedule [place] at the hospital because [place] tech is out sick. Order placed ID referral for history of latent TB. Patient reporting cough  for the last month, concern for transition to active TB.  Patient reports 46 lbs of weight gain this pregnancy. Encouraged to maintain current weight to minimize further pregnancy weight gain.   Gestational age appropriate obstetric precautions including but not limited to vaginal bleeding, contractions, leaking of fluid and fetal movement were reviewed in detail with the patient.    Return in about 2 weeks (around 03/09/2020) for ROB in person .  Natale Milch MD [facility], [facility] 02/24/2020, 3:09 PM

## 2020-02-26 ENCOUNTER — Other Ambulatory Visit: Payer: Self-pay | Admitting: Obstetrics and Gynecology

## 2020-02-26 DIAGNOSIS — Z3689 Encounter for other specified antenatal screening: Secondary | ICD-10-CM

## 2020-02-26 DIAGNOSIS — Z3493 Encounter for supervision of normal pregnancy, unspecified, third trimester: Secondary | ICD-10-CM

## 2020-02-29 ENCOUNTER — Ambulatory Visit
Admission: RE | Admit: 2020-02-29 | Discharge: 2020-02-29 | Disposition: A | Discharge status: Home / Self Care | Payer: 59 | Source: Ambulatory Visit | Attending: Obstetrics and Gynecology | Admitting: Obstetrics and Gynecology

## 2020-02-29 ENCOUNTER — Other Ambulatory Visit: Payer: Self-pay

## 2020-02-29 DIAGNOSIS — Z3493 Encounter for supervision of normal pregnancy, unspecified, third trimester: Secondary | ICD-10-CM | POA: Diagnosis not present

## 2020-02-29 DIAGNOSIS — Z3689 Encounter for other specified antenatal screening: Secondary | ICD-10-CM | POA: Insufficient documentation

## 2020-03-07 ENCOUNTER — Encounter: Payer: 59 | Admitting: Obstetrics and Gynecology

## 2020-03-07 ENCOUNTER — Other Ambulatory Visit: Payer: 59

## 2020-03-08 ENCOUNTER — Ambulatory Visit: Payer: 59 | Attending: Infectious Diseases | Admitting: Infectious Diseases

## 2020-03-08 ENCOUNTER — Other Ambulatory Visit: Payer: Self-pay

## 2020-03-08 ENCOUNTER — Encounter: Payer: Self-pay | Admitting: Infectious Diseases

## 2020-03-08 VITALS — BP 110/83 | HR 114 | Temp 98.3°F | Resp 16 | Ht 73.0 in | Wt 177.0 lb

## 2020-03-08 DIAGNOSIS — Z227 Latent tuberculosis: Secondary | ICD-10-CM

## 2020-03-08 DIAGNOSIS — Z3A34 34 weeks gestation of pregnancy: Secondary | ICD-10-CM | POA: Diagnosis not present

## 2020-03-08 DIAGNOSIS — Z79899 Other long term (current) drug therapy: Secondary | ICD-10-CM | POA: Insufficient documentation

## 2020-03-08 DIAGNOSIS — O26893 Other specified pregnancy related conditions, third trimester: Secondary | ICD-10-CM | POA: Insufficient documentation

## 2020-03-08 DIAGNOSIS — R059 Cough, unspecified: Secondary | ICD-10-CM | POA: Diagnosis not present

## 2020-03-08 NOTE — Progress Notes (Signed)
NAME: Patricia Ashley  DOB: 13-Nov-1990  MRN: [id number]  Date/Time: 03/08/2020 11:48 AM   Subjective:  Pt is referred to me by her Ob Dr.Schuman ? Patricia Ashley is a 29 y.o. Female from [place], been in the [place] since May 2021 [redacted] weeks pregnant is referred to me for a cough and positive quantiferon Gold History obtained from patient and her husband and records from [place], [place] reviewed. This is her first pregnancy She came to the [place] in May 2021. She was in [place] . During her first trimester she started having a cough and as part of the work up in [employer] a quantiferon gold was done on 10/19/19 which was posiitve A CXR on 10/26/19 CXR -was normal and showed no active or remote TB She was seen by ID Dr.Michael kessler 3 sputums were sent for AFB 10/29/19 10/30/19, 10/31/19  and were  neg HIV NR, RPR NR  10/07/2019 Plan was to treat her for latent TB after she delivered Meanwhile she temporarily moved to [place] to have some family help during her pregnancy. Her cough got better during the 2nd trimester only to get worse when she entered the third trimester- She says sleeping on the rt side makes her cough, eating fruits like watermelon and citrus fruis makes her cough..She has no heartburn but tastes food/liquids after a few hours She has some white sputum Cold weather makes her  cough more No fever , no night sweats, good appetite Weight gain of 20kgs during this pregnancy Has no t been treated for TB or known exposure to TB when in [place] No BCG vaccination  Past Medical History:  Diagnosis Date  . Latent tuberculosis     Past Surgical History:  Procedure Laterality Date  . PARTIAL HYMENECTOMY      Social History   Socioeconomic History  . Marital status: Married    Spouse name: Not on file  . Number of children: Not on file  . Years of education: Not on file  . Highest education level: Not on file  Occupational History  . Not on file  Tobacco Use  . Smoking status:  Never Smoker  . Smokeless tobacco: Never Used  Vaping Use  . Vaping Use: Never used  Substance and Sexual Activity  . Alcohol use: Never  . Drug use: Never  . Sexual activity: Never  Other Topics Concern  . Not on file  Social History Narrative  . Not on file   Social Determinants of Health   Financial Resource Strain:   . Difficulty of Paying Living Expenses: Not on file  Food Insecurity:   . Worried About Programme researcher, broadcasting/film/video in the Last Year: Not on file  . Ran Out of Food in the Last Year: Not on file  Transportation Needs:   . Lack of Transportation (Medical): Not on file  . Lack of Transportation (Non-Medical): Not on file  Physical Activity:   . Days of Exercise per Week: Not on file  . Minutes of Exercise per Session: Not on file  Stress:   . Feeling of Stress : Not on file  Social Connections:   . Frequency of Communication with Friends and Family: Not on file  . Frequency of Social Gatherings with Friends and Family: Not on file  . Attends Religious Services: Not on file  . Active Member of Clubs or Organizations: Not on file  . Attends Banker Meetings: Not on file  . Marital Status: Not on file  Intimate  Partner Violence:   . Fear of Current or Ex-Partner: Not on file  . Emotionally Abused: Not on file  . Physically Abused: Not on file  . Sexually Abused: Not on file    Family History  Problem Relation Age of Onset  . Congestive Heart Failure Mother   . Diabetes Mother   . Hypertension Mother   . Diabetes Father   . Hypertension Father    No Known Allergies  ? Current Outpatient Medications  Medication Sig Dispense Refill  . Dextromethorphan Polistirex (ROBITUSSIN 12 HOUR COUGH PO) Take by mouth.    . Prenatal Vit-Fe Fumarate-FA (MULTIVITAMIN-PRENATAL) 27-0.8 MG TABS tablet Take 1 tablet by mouth daily at 12 noon.     No current facility-administered medications for this visit.     Abtx:  Anti-infectives (From admission, onward)    None      REVIEW OF SYSTEMS:  Const: negative fever, negative chills, negative weight loss Eyes: negative diplopia or visual changes, negative eye pain ENT: negative coryza, negative sore throat Resp:  cough, NO hemoptysis, dyspnea Cards: negative for chest pain, palpitations, lower extremity edema GU: negative for frequency, dysuria and hematuria GI: Negative for abdominal pain, diarrhea, bleeding, constipation Skin: negative for rash and pruritus Heme: negative for easy bruising and gum/nose bleeding MS: negative for myalgias, arthralgias, back pain and muscle weakness Neurolo:negative for headaches, dizziness, vertigo, memory problems  Psych: negative for feelings of anxiety, depression  Endocrine: negative for thyroid, diabetes Allergy/Immunology- negative for any medication or food allergies ? Objective:  VITALS:  BP 110/83   Pulse (!) 114   Temp 98.3 F (36.8 C)   Resp 16   Ht 6\' 1"  (1.854 m)   Wt 177 lb (80.3 kg)   LMP 07/12/2019   SpO2 98%   BMI 23.35 kg/m  PHYSICAL EXAM:  General: Alert, cooperative, no distress, appears stated age.  Head: Normocephalic, without obvious abnormality, atraumatic. Eyes: Conjunctivae clear, anicteric sclerae. Pupils are equal ENT swollen turbinates Lips, mucosa, and tongue normal. No Thrush Neck: Supple, symmetrical, no adenopathy, thyroid: non tender no carotid bruit and no JVD. Back: No CVA tenderness. Lungs: Clear to auscultation bilaterally. No Wheezing or Rhonchi. No rales. Heart: Regular rate and rhythm, no murmur, rub or gallop. Abdomen: uterus above umbilicus Extremities: atraumatic, no cyanosis. No edema. No clubbing Skin: No rashes or lesions. Or bruising Lymph: Cervical, supraclavicular normal. Neurologic: Grossly non-focal Pertinent Labs As above  ? Impression/Recommendation ?[redacted] week pregnant female  from 09/11/2019 with cough  Cough is likely due to GERD with possible seasonal /environmental etiology This is not  due to latent TB nor active TB She does not have active TB There is no reason to repeat CXR now  Positive quantiferon gold as part of work up for cough in July 2021, Negative 3 sputums for AFB and Negative CXR- She does not have active TB Can hold off on treating latent TB until 2 months after her delivery  HIV negative No immune compromised state or use of meds than can lower immunity  Her pregnancy puts her at risk for GERD and the cough also started then Lifestyle changes discussed with her Asked her to try antacids Discuss with OB regarding further management of GERD / ?? Allergic rhinitis-use nasal saline spray . She will  Discuss with Ob regarding use of loratidine? ___________________________________________________ Discussed with patient, her husband  requesting provider Note:  This document was prepared using Dragon voice recognition software and may include unintentional dictation errors.

## 2020-03-08 NOTE — Patient Instructions (Addendum)
You are here for a positive blood test called quantiferon gold-when you were in [place]- you had a cxr in July which wa snegative and 3 sputums were negative You were seen by ID physician who recommended you get treated for latent Tb  After you deliver. You are currently [redacted] weeks pregnant. You may have GERD( acid reflux) do not eat or drink milk just before you go to bed- give atleast 2-3 hrs before   Heartburn During Pregnancy Heartburn is a type of pain or discomfort that can happen in the throat or chest. It is often described as a burning sensation. Heartburn is common during pregnancy because:  A hormone (progesterone) that is released during pregnancy may relax the valve (lower esophageal sphincter, or LES) that separates the esophagus from the stomach. This allows stomach acid to move up into the esophagus, causing heartburn.  The uterus gets larger and pushes up on the stomach, which pushes more acid into the esophagus. This is especially true in the later stages of pregnancy. Heartburn usually goes away or gets better after giving birth. What are the causes? Heartburn is caused by stomach acid backing up into the esophagus (reflux). Reflux can be triggered by:  Changing hormone levels.  Large meals.  Certain foods and beverages, such as coffee, chocolate, onions, and peppermint.  Exercise.  Increased stomach acid production. What increases the risk? You are more likely to experience heartburn during pregnancy if you:  Had heartburn prior to becoming pregnant.  Have been pregnant more than once before.  Are overweight or obese. The likelihood that you will get heartburn also increases as you get farther along in your pregnancy, especially during the last trimester. What are the signs or symptoms? Symptoms of this condition include:  Burning pain in the chest or lower throat.  Bitter taste in the mouth.  Coughing.  Problems swallowing.  Vomiting.  Hoarse  voice.  Asthma. Symptoms may get worse when you lie down or bend over. Symptoms are often worse at night. How is this diagnosed? This condition is diagnosed based on:  Your medical history.  Your symptoms.  Blood tests to check for a certain type of bacteria associated with heartburn.  Whether taking heartburn medicine relieves your symptoms.  Examination of the stomach and esophagus using a tube with a light and camera on the end (endoscopy). How is this treated? Treatment varies depending on how severe your symptoms are. Your health care provider may recommend:  Over-the-counter medicines (antacids or acid reducers) for mild heartburn.  Prescription medicines to decrease stomach acid or to protect your stomach lining.  Certain changes in your diet.  Raising the head of your bed so it is higher than the foot of the bed. This helps prevent stomach acid from backing up into the esophagus when you are lying down. Follow these instructions at home: Eating and drinking  Do not drink alcohol during your pregnancy.  Identify foods and beverages that make your symptoms worse, and avoid them.  Beverages that you may want to avoid include: ? Coffee and tea (with or without caffeine). ? Energy drinks and sports drinks. ? Carbonated drinks or sodas. ? Citrus fruit juices.  Foods that you may want to avoid include: ? Chocolate and cocoa. ? Peppermint and mint flavorings. ? Garlic, onions, and horseradish. ? Spicy and acidic foods, including peppers, chili powder, curry powder, vinegar, hot sauces, and barbecue sauce. ? Citrus fruits, such as oranges, lemons, and limes. ? Tomato-based foods, such as  red sauce, chili, and salsa. ? Fried and fatty foods, such as donuts, french fries, potato chips, and high-fat dressings. ? High-fat meats, such as hot dogs, cold cuts, sausage, ham, and bacon. ? High-fat dairy items, such as whole milk, butter, and cheese.  Eat small, frequent meals  instead of large meals.  Avoid drinking large amounts of liquid with your meals.  Avoid eating meals during the 2-3 hours before bedtime.  Avoid lying down right after you eat.  Do not exercise right after you eat. Medicines  Take over-the-counter and prescription medicines only as told by your health care provider.  Do not take aspirin, ibuprofen, or other NSAIDs unless your health care provider tells you to do that.  You may be instructed to avoid medicines that contain sodium bicarbonate. General instructions   If directed, raise the head of your bed about 6 inches (15 cm) by putting blocks under the legs. Sleeping with more pillows does not effectively relieve heartburn because it only changes the position of your head.  Do not use any products that contain nicotine or tobacco, such as cigarettes and e-cigarettes. If you need help quitting, ask your health care provider.  Wear loose-fitting clothing.  Try to reduce your stress, such as with yoga or meditation. If you need help managing stress, ask your health care provider.  Maintain a healthy weight. If you are overweight, work with your health care provider to safely lose weight.  Keep all follow-up visits as told by your health care provider. This is important. Contact a health care provider if:  You develop new symptoms.  Your symptoms do not improve with treatment.  You have unexplained weight loss.  You have difficulty swallowing.  You make loud sounds when you breathe (wheeze).  You have a cough that does not go away.  You have frequent heartburn for more than 2 weeks.  You have nausea or vomiting that does not get better with treatment.  You have pain in your abdomen. Get help right away if:  You have severe chest pain that spreads to your arm, neck, or jaw.  You feel sweaty, dizzy, or light-headed.  You have shortness of breath.  You have pain when swallowing.  You vomit, and your vomit looks  like blood or coffee grounds.  Your stool is bloody or black. This information is not intended to replace advice given to you by your health care provider. Make sure you discuss any questions you have with your health care provider. Document Revised: 06/24/2017 Document Reviewed: 12/12/2015 Elsevier Patient Education  2020 [employer].

## 2020-03-10 ENCOUNTER — Other Ambulatory Visit: Payer: Self-pay

## 2020-03-10 ENCOUNTER — Ambulatory Visit (INDEPENDENT_AMBULATORY_CARE_PROVIDER_SITE_OTHER): Payer: 59 | Admitting: Advanced Practice Midwife

## 2020-03-10 ENCOUNTER — Encounter: Payer: Self-pay | Admitting: Advanced Practice Midwife

## 2020-03-10 VITALS — BP 118/74 | Wt 178.0 lb

## 2020-03-10 DIAGNOSIS — Z3403 Encounter for supervision of normal first pregnancy, third trimester: Secondary | ICD-10-CM

## 2020-03-10 DIAGNOSIS — Z3A34 34 weeks gestation of pregnancy: Secondary | ICD-10-CM

## 2020-03-10 NOTE — Progress Notes (Signed)
Routine Prenatal Care Visit  Subjective  Patricia Ashley is a 29 y.o. G1P0 at [redacted]w[redacted]d being seen today for ongoing prenatal care.  She is currently monitored for the following issues for this low-risk pregnancy and has Labor and delivery, indication for care; Encounter for supervision of normal first pregnancy in third trimester; [redacted] weeks gestation of pregnancy; Supervision of high risk pregnancy, antepartum; and Obesity affecting pregnancy in third trimester on their problem list.  ----------------------------------------------------------------------------------- Patient reports generally feeling well. She has questions about anatomy scan done 2 weeks ago and suggestions for medication to treat heartburn.    We reviewed the results of anatomy scan done 2 weeks ago: incomplete for RVOT/LVOT/upper lip, placenta anterior, cephalic, growth 76%, 2172 g, AFI 16.7, female Contractions: Not present. Vag. Bleeding: None.  Movement: Present. Leaking Fluid denies.  ----------------------------------------------------------------------------------- The following portions of the patient's history were reviewed and updated as appropriate: allergies, current medications, past family history, past medical history, past social history, past surgical history and problem list. Problem list updated.  Objective  Blood pressure 118/74, weight 178 lb (80.7 kg), last menstrual period 07/12/2019. Pregravid weight 127 lb (57.6 kg) Total Weight Gain 51 lb (23.1 kg) Urinalysis: Urine Protein    Urine Glucose    Fetal Status: Fetal Heart Rate (bpm): 139 Fundal Height: 35 cm Movement: Present     General:  Alert, oriented and cooperative. Patient is in no acute distress.  Skin: Skin is warm and dry. No rash noted.   Cardiovascular: Normal heart rate noted  Respiratory: Normal respiratory effort, no problems with respiration noted  Abdomen: Soft, gravid, appropriate for gestational age. Pain/Pressure: Absent      Pelvic:  Cervical exam deferred        Extremities: Normal range of motion.  Edema: None  Mental Status: Normal mood and affect. Normal behavior. Normal judgment and thought content.   Assessment   29 y.o. G1P0 at [redacted]w[redacted]d by  04/17/2020, by Last Menstrual Period presenting for routine prenatal visit  Plan   G1 Problems (from 01/29/20 to present)    Problem Noted Resolved   Supervision of high risk pregnancy, antepartum 02/05/2020 by Nadara Mustard, MD No   Overview Addendum 02/24/2020  2:59 PM by Natale Milch, MD    Clinic [facility] Prenatal Labs  Dating LMP Blood type:   - B+  Genetic Screen 1 Screen:  NEG Antibody: Neg  Anatomic [place] in [place], nml Rubella:   - NonImmune  Varicella:   GTT Third trimester:125  RPR:   - neg Trep test  Rhogam n/a HBsAg:   - NR  TDaP vaccine    02/24/2020           Flu Shot: 01/14/2020 HIV:   - NR  Baby Food   Breast                             GBS:   Contraception  Undecided Pap: will need postpartum  CBB  No   CS/VBAC n/a   Support Person Husband     High Risk Pregnancy Diagnoses Microperforate hymen, 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 Encouraged to maintain current weight as much as feasible.           Previous Version   Encounter for supervision of normal first pregnancy in third trimester 01/29/2020 by Conard Novak, MD No  [redacted] weeks gestation of pregnancy 01/29/2020 by Conard Novak, MD No    Follow up anatomy in 2 weeks Pepcid for heartburn   Preterm labor symptoms and general obstetric precautions including but not limited to vaginal bleeding, contractions, leaking of fluid and fetal movement were reviewed in detail with the patient. Please refer to After Visit Summary for other counseling recommendations.   Return in about 2 weeks (around 03/24/2020) for follow up growth and  anatomy scan and rob.  [name], CNM 03/10/2020 4:28 PM

## 2020-03-10 NOTE — Patient Instructions (Signed)
Exercise During Pregnancy Exercise is an important part of being healthy for people of all ages. Exercise improves the function of your heart and lungs and helps you maintain strength, flexibility, and a healthy body weight. Exercise also boosts energy levels and elevates mood. Most women should exercise regularly during pregnancy. In rare cases, women with certain medical conditions or complications may be asked to limit or avoid exercise during pregnancy. How does this affect me? Along with maintaining general strength and flexibility, exercising during pregnancy can help:  Keep strength in muscles that are used during labor and childbirth.  Decrease low back pain.  Reduce symptoms of depression.  Control weight gain during pregnancy.  Reduce the risk of needing insulin if you develop diabetes during pregnancy.  Decrease the risk of cesarean delivery.  Speed up your recovery after giving birth. How does this affect my baby? Exercise can help you have a healthy pregnancy. Exercise does not cause premature birth. It will not cause your baby to weigh less at birth. What exercises can I do? Many exercises are safe for you to do during pregnancy. Do a variety of exercises that safely increase your heart and breathing rates and help you build and maintain muscle strength. Do exercises exactly as told by your health care provider. You may do these exercises:  Walking or hiking.  Swimming.  Water aerobics.  Riding a stationary bike.  Strength training.  Modified yoga or Pilates. Tell your instructor that you are pregnant. Avoid overstretching, and avoid lying on your back for long periods of time.  Running or jogging. Only choose this type of exercise if you: ? Ran or jogged regularly before your pregnancy. ? Can run or jog and still talk in complete sentences. What exercises should I avoid? Depending on your level of fitness and whether you exercised regularly before your  pregnancy, you may be told to limit high-intensity exercise. You can tell that you are exercising at a high intensity if you are breathing much harder and faster and cannot hold a conversation while exercising. You must avoid:  Contact sports.  Activities that put you at risk for falling on or being hit in the belly, such as downhill skiing, water skiing, surfing, rock climbing, cycling, gymnastics, and horseback riding.  Scuba diving.  Skydiving.  Yoga or Pilates in a room that is heated to high temperatures.  Jogging or running, unless you ran or jogged regularly before your pregnancy. While jogging or running, you should always be able to talk in full sentences. Do not run or jog so fast that you are unable to have a conversation.  Do not exercise at more than 6,000 feet above sea level (high elevation) if you are not used to exercising at high elevation. How do I exercise in a safe way?   Avoid overheating. Do not exercise in very high temperatures.  Wear loose-fitting, breathable clothes.  Avoid dehydration. Drink enough water before, during, and after exercise to keep your urine pale yellow.  Avoid overstretching. Because of hormone changes during pregnancy, it is easy to overstretch muscles, tendons, and ligaments during pregnancy.  Start slowly and ask your health care provider to recommend the types of exercise that are safe for you.  Do not exercise to lose weight. Follow these instructions at home:  Exercise on most days or all days of the week. Try to exercise for 30 minutes a day, 5 days a week, unless your health care provider tells you not to.  If   you actively exercised before your pregnancy and you are healthy, your health care provider may tell you to continue to do moderate to high-intensity exercise.  If you are just starting to exercise or did not exercise much before your pregnancy, your health care provider may tell you to do low to moderate-intensity  exercise. Questions to ask your health care provider  Is exercise safe for me?  What are signs that I should stop exercising?  Does my health condition mean that I should not exercise during pregnancy?  When should I avoid exercising during pregnancy? Stop exercising and contact a health care provider if: You have any unusual symptoms, such as:  Mild contractions of the uterus or cramps in the abdomen.  Dizziness that does not go away when you rest. Stop exercising and get help right away if: You have any unusual symptoms, such as:  Sudden, severe pain in your low back or your belly.  Mild contractions of the uterus or cramps in the abdomen that do not improve with rest and drinking fluids.  Chest pain.  Bleeding or fluid leaking from your vagina.  Shortness of breath. These symptoms may represent a serious problem that is an emergency. Do not wait to see if the symptoms will go away. Get medical help right away. Call your local emergency services (911 in the [place] Do not drive yourself to the hospital. Summary  Most women should exercise regularly throughout pregnancy. In rare cases, women with certain medical conditions or complications may be asked to limit or avoid exercise during pregnancy.  Do not exercise to lose weight during pregnancy.  Your health care provider will tell you what level of physical activity is right for you.  Stop exercising and contact a health care provider if you have mild contractions of the uterus or cramps in the abdomen. Get help right away if these contractions or cramps do not improve with rest and drinking fluids.  Stop exercising and get help right away if you have sudden, severe pain in your low back or belly, chest pain, shortness of breath, or bleeding or leaking of fluid from your vagina. This information is not intended to replace advice given to you by your health care provider. Make sure you discuss any questions you have with your  health care provider. Document Revised: 07/17/2018 Document Reviewed: 04/30/2018 Elsevier Patient Education  2020 [employer]  

## 2020-03-10 NOTE — Progress Notes (Signed)
No vb. No lof.  

## 2020-03-24 ENCOUNTER — Other Ambulatory Visit: Payer: Self-pay

## 2020-03-24 ENCOUNTER — Ambulatory Visit (INDEPENDENT_AMBULATORY_CARE_PROVIDER_SITE_OTHER): Payer: 59

## 2020-03-24 ENCOUNTER — Encounter: Payer: Self-pay | Admitting: Obstetrics and Gynecology

## 2020-03-24 ENCOUNTER — Ambulatory Visit (INDEPENDENT_AMBULATORY_CARE_PROVIDER_SITE_OTHER): Payer: 59 | Admitting: Obstetrics and Gynecology

## 2020-03-24 ENCOUNTER — Other Ambulatory Visit (HOSPITAL_COMMUNITY)
Admission: RE | Admit: 2020-03-24 | Discharge: 2020-03-24 | Disposition: A | Discharge status: Home / Self Care | Payer: 59 | Source: Ambulatory Visit | Attending: Family Medicine | Admitting: Family Medicine

## 2020-03-24 VITALS — BP 122/74 | Wt 183.0 lb

## 2020-03-24 DIAGNOSIS — Z3A36 36 weeks gestation of pregnancy: Secondary | ICD-10-CM | POA: Diagnosis not present

## 2020-03-24 DIAGNOSIS — O368130 Decreased fetal movements, third trimester, not applicable or unspecified: Secondary | ICD-10-CM

## 2020-03-24 DIAGNOSIS — O99213 Obesity complicating pregnancy, third trimester: Secondary | ICD-10-CM

## 2020-03-24 DIAGNOSIS — Z3403 Encounter for supervision of normal first pregnancy, third trimester: Secondary | ICD-10-CM | POA: Diagnosis not present

## 2020-03-24 DIAGNOSIS — O0993 Supervision of high risk pregnancy, unspecified, third trimester: Secondary | ICD-10-CM

## 2020-03-24 NOTE — Progress Notes (Signed)
Routine Prenatal Care Visit  Subjective  Quintella Duffner is a 29 y.o. G1P0 at [redacted]w[redacted]d being seen today for ongoing prenatal care.  She is currently monitored for the following issues for this low-risk pregnancy and has Labor and delivery, indication for care; Encounter for supervision of normal first pregnancy in third trimester; [redacted] weeks gestation of pregnancy; Supervision of high risk pregnancy, antepartum; and Obesity affecting pregnancy in third trimester on their problem list.  ----------------------------------------------------------------------------------- Patient reports no complaints.   Contractions: Irregular. Vag. Bleeding: None.  Movement: (!) Decreased. Leaking Fluid denies.  Growth u/s: 73rd%ile, AC 96th %ile.  AFI normal. Cephalic.  ----------------------------------------------------------------------------------- The following portions of the patient's history were reviewed and updated as appropriate: allergies, current medications, past family history, past medical history, past social history, past surgical history and problem list. Problem list updated.  Objective  Blood pressure 122/74, weight 183 lb (83 kg), last menstrual period 07/12/2019. Pregravid weight 127 lb (57.6 kg) Total Weight Gain 56 lb (25.4 kg) Urinalysis: Urine Protein    Urine Glucose    Fetal Status: Fetal Heart Rate (bpm): 145 Fundal Height: 36 cm Movement: (!) Decreased  Presentation: Vertex  General:  Alert, oriented and cooperative. Patient is in no acute distress.  Skin: Skin is warm and dry. No rash noted.   Cardiovascular: Normal heart rate noted  Respiratory: Normal respiratory effort, no problems with respiration noted  Abdomen: Soft, gravid, appropriate for gestational age. Pain/Pressure: Absent     Pelvic:  Cervical exam deferred        Extremities: Normal range of motion.  Edema: None  Mental Status: Normal mood and affect. Normal behavior. Normal judgment and thought content.    NST: Baseline FHR: 145 beats/min Variability: moderate Accelerations: present Decelerations: absent Tocometry: not done  Interpretation:  INDICATIONS: decreased fetal movement RESULTS:  A NST procedure was performed with FHR monitoring and a normal baseline established, appropriate time of 20-40 minutes of evaluation, and accels >2 seen w 15x15 characteristics.  Results show a REACTIVE NST.    Assessment   29 y.o. G1P0 at [redacted]w[redacted]d by  04/17/2020, by Last Menstrual Period presenting for routine prenatal visit  Plan   G1 Problems (from 01/29/20 to present)    Problem Noted Resolved   Supervision of high risk pregnancy, antepartum 02/05/2020 by Nadara Mustard, MD No   Overview Addendum 02/24/2020  2:59 PM by Natale Milch, MD    [facility]inic [facility] Prenatal Labs  Dating LMP Blood type:   - B+  Genetic Screen 1 Screen:  NEG Antibody: Neg  Anatomic [place] in [place], nml Rubella:   - NonImmune  Varicella:   GTT Third trimester:125  RPR:   - neg Trep test  Rhogam n/a HBsAg:   - NR  TDaP vaccine    02/24/2020           Flu Shot: 01/14/2020 HIV:   - NR  Baby Food   Breast                             GBS:   Contraception  Undecided Pap: will need postpartum  CBB  No   CS/VBAC n/a   Support Person Husband     High Risk Pregnancy Diagnoses Microperforate hymen, 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 Encouraged to maintain current weight as much as feasible.  Previous Version   Encounter for supervision of normal first pregnancy in third trimester 01/29/2020 by Conard Novak, MD No   [redacted] weeks gestation of pregnancy 01/29/2020 by Conard Novak, MD No       Preterm labor symptoms and general obstetric precautions including but not limited to vaginal bleeding, contractions, leaking of fluid and fetal movement were reviewed in detail  with the patient. Please refer to After Visit Summary for other counseling recommendations.   - patient reassured about fetal movement. NST reactive - reviewed growth u/s. - GBS/aptima today  Return in about 1 week (around 03/31/2020) for Routine Prenatal Appointment.   Thomasene Mohair, MD, [facility], [facility] 03/24/2020 6:04 PM

## 2020-03-27 LAB — STREP GP B NAA: Strep Gp B NAA: NEGATIVE

## 2020-03-28 LAB — CERVICOVAGINAL ANCILLARY ONLY
Chlamydia: NEGATIVE
Comment: NEGATIVE
Comment: NORMAL
Neisseria Gonorrhea: NEGATIVE

## 2020-03-29 ENCOUNTER — Encounter: Payer: Self-pay | Admitting: Obstetrics and Gynecology

## 2020-03-30 ENCOUNTER — Other Ambulatory Visit: Payer: Self-pay

## 2020-03-30 ENCOUNTER — Ambulatory Visit (INDEPENDENT_AMBULATORY_CARE_PROVIDER_SITE_OTHER): Payer: 59 | Admitting: Obstetrics and Gynecology

## 2020-03-30 ENCOUNTER — Encounter: Payer: Self-pay | Admitting: Obstetrics and Gynecology

## 2020-03-30 VITALS — BP 120/74 | Wt 185.0 lb

## 2020-03-30 DIAGNOSIS — O0990 Supervision of high risk pregnancy, unspecified, unspecified trimester: Secondary | ICD-10-CM

## 2020-03-30 DIAGNOSIS — Z3A37 37 weeks gestation of pregnancy: Secondary | ICD-10-CM

## 2020-03-30 NOTE — Progress Notes (Signed)
Routine Prenatal Care Visit  Subjective  Patricia Ashley is a 29 y.o. G1P0 at [redacted]w[redacted]d being seen today for ongoing prenatal care.  She is currently monitored for the following issues for this high-risk pregnancy and has Supervision of high risk pregnancy, antepartum and Obesity affecting pregnancy in third trimester on their problem list.  ----------------------------------------------------------------------------------- Patient reports no complaints.   Contractions: Not present. Vag. Bleeding: None.  Movement: Present. Denies leaking of fluid.  ----------------------------------------------------------------------------------- The following portions of the patient's history were reviewed and updated as appropriate: allergies, current medications, past family history, past medical history, past social history, past surgical history and problem list. Problem list updated.   Objective  Blood pressure 120/74, weight 185 lb (83.9 kg), last menstrual period 07/12/2019, unknown if currently breastfeeding. Pregravid weight 127 lb (57.6 kg) Total Weight Gain 58 lb (26.3 kg) Urinalysis:      Fetal Status: Fetal Heart Rate (bpm): 140 Fundal Height: 37 cm Movement: Present  Presentation: Vertex  General:  Alert, oriented and cooperative. Patient is in no acute distress.  Skin: Skin is warm and dry. No rash noted.   Cardiovascular: Normal heart rate noted  Respiratory: Normal respiratory effort, no problems with respiration noted  Abdomen: Soft, gravid, appropriate for gestational age. Pain/Pressure: Absent     Pelvic:  Cervical exam performed per patient request. Dilation: Closed Effacement (%): Thick Station: Ballotable  Extremities: Normal range of motion.  Edema: None  ental Status: Normal mood and affect. Normal behavior. Normal judgment and thought content.     Assessment   29 y.o. G1P0 at [redacted]w[redacted]d by  04/17/2020, by Last Menstrual Period presenting for routine prenatal visit  Plan    G1 Problems (from 01/29/20 to present)    Problem Noted Resolved   Supervision of high risk pregnancy, antepartum 02/05/2020 by Nadara Mustard, MD No   Overview Addendum 02/24/2020  2:59 PM by Natale Milch, MD    [facility]inic [facility] Prenatal Labs  Dating LMP Blood type:   - B+  Genetic Screen 1 Screen:  NEG Antibody: Neg  [place]atomic [place] in [place], nml Rubella:   - NonImmune  Varicella:   GTT Third trimester:125  RPR:   - neg Trep test  Rhogam n/a HBsAg:   - NR  TDaP vaccine    02/24/2020           Flu Shot: 01/14/2020 HIV:   - NR  Baby Food   Breast                             GBS:   Contraception  Undecided Pap: will need postpartum  CBB  No   CS/VBAC n/a   Support Person Husband     High Risk Pregnancy Diagnoses Microperforate hymen, 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 Encouraged to maintain current weight as much as feasible.           Previous Version   Encounter for supervision of normal first pregnancy in third trimester 01/29/2020 by Conard Novak, MD 03/30/2020 by Zipporah Plants, CNM   [redacted] weeks gestation of pregnancy 01/29/2020 by Conard Novak, MD 03/29/2020 by Zipporah Plants, CNM      -Discussed pain management options in labor - patient desires un-medicated delivery  Term labor precautions including but not limited to vaginal bleeding, contractions, leaking of fluid and fetal movement  were reviewed in detail with the patient.    Return in about 1 week (around 04/06/2020) for ROB - can schedule out - prefer female providers.  Zipporah Plants, CNM, MSN [facility], [facility] 03/30/2020, 4:23 PM

## 2020-03-30 NOTE — Progress Notes (Signed)
No vb. No lof. Cervical check today per pt

## 2020-04-05 ENCOUNTER — Inpatient Hospital Stay: Payer: 59 | Admitting: Anesthesiology

## 2020-04-05 ENCOUNTER — Other Ambulatory Visit: Payer: Self-pay

## 2020-04-05 ENCOUNTER — Inpatient Hospital Stay
Admission: EM | Admit: 2020-04-05 | Discharge: 2020-04-07 | DRG: 807 | Disposition: A | Discharge status: Home / Self Care | Payer: 59 | Attending: Obstetrics and Gynecology | Admitting: Obstetrics and Gynecology

## 2020-04-05 ENCOUNTER — Encounter: Payer: 59 | Admitting: Obstetrics

## 2020-04-05 ENCOUNTER — Encounter: Payer: Self-pay | Admitting: Obstetrics & Gynecology

## 2020-04-05 DIAGNOSIS — O99214 Obesity complicating childbirth: Secondary | ICD-10-CM | POA: Diagnosis present

## 2020-04-05 DIAGNOSIS — Z8615 Personal history of latent tuberculosis infection: Secondary | ICD-10-CM | POA: Diagnosis not present

## 2020-04-05 DIAGNOSIS — O4292 Full-term premature rupture of membranes, unspecified as to length of time between rupture and onset of labor: Secondary | ICD-10-CM | POA: Diagnosis present

## 2020-04-05 DIAGNOSIS — O0990 Supervision of high risk pregnancy, unspecified, unspecified trimester: Secondary | ICD-10-CM

## 2020-04-05 DIAGNOSIS — Z20822 Contact with and (suspected) exposure to covid-19: Secondary | ICD-10-CM | POA: Diagnosis present

## 2020-04-05 DIAGNOSIS — Z3A38 38 weeks gestation of pregnancy: Secondary | ICD-10-CM | POA: Diagnosis not present

## 2020-04-05 DIAGNOSIS — O4290 Premature rupture of membranes, unspecified as to length of time between rupture and onset of labor, unspecified weeks of gestation: Secondary | ICD-10-CM | POA: Diagnosis present

## 2020-04-05 DIAGNOSIS — E669 Obesity, unspecified: Secondary | ICD-10-CM | POA: Diagnosis present

## 2020-04-05 DIAGNOSIS — O99213 Obesity complicating pregnancy, third trimester: Secondary | ICD-10-CM | POA: Diagnosis present

## 2020-04-05 DIAGNOSIS — O26893 Other specified pregnancy related conditions, third trimester: Secondary | ICD-10-CM | POA: Diagnosis present

## 2020-04-05 LAB — CBC
HCT: 35.7 % — ABNORMAL LOW (ref 36.0–46.0)
Hemoglobin: 12 g/dL (ref 12.0–15.0)
MCH: 31.2 pg (ref 26.0–34.0)
MCHC: 33.6 g/dL (ref 30.0–36.0)
MCV: 92.7 fL (ref 80.0–100.0)
Platelets: 185 10*3/uL (ref 150–400)
RBC: 3.85 MIL/uL — ABNORMAL LOW (ref 3.87–5.11)
RDW: 14.1 % (ref 11.5–15.5)
WBC: 12.8 10*3/uL — ABNORMAL HIGH (ref 4.0–10.5)
nRBC: 0 % (ref 0.0–0.2)

## 2020-04-05 LAB — TYPE AND SCREEN
ABO/RH(D): B POS
Antibody Screen: NEGATIVE

## 2020-04-05 LAB — RUPTURE OF MEMBRANE (ROM)PLUS: Rom Plus: POSITIVE

## 2020-04-05 LAB — SARS CORONAVIRUS 2 BY RT PCR (HOSPITAL ORDER, PERFORMED IN ~~LOC~~ HOSPITAL LAB): SARS Coronavirus 2: NEGATIVE

## 2020-04-05 MED ORDER — PHENYLEPHRINE 40 MCG/ML (10ML) SYRINGE FOR IV PUSH (FOR BLOOD PRESSURE SUPPORT)
80.0000 ug | PREFILLED_SYRINGE | INTRAVENOUS | Status: DC | PRN
  Administered 2020-04-05 (×2): 80 ug via INTRAVENOUS

## 2020-04-05 MED ORDER — LACTATED RINGERS IV SOLN
500.0000 mL | Freq: Once | INTRAVENOUS | Status: AC
  Administered 2020-04-05: 500 mL via INTRAVENOUS

## 2020-04-05 MED ORDER — FENTANYL 2.5 MCG/ML W/ROPIVACAINE 0.15% IN NS 100 ML EPIDURAL (ARMC)
EPIDURAL | Status: AC
  Filled 2020-04-05: qty 100

## 2020-04-05 MED ORDER — EPHEDRINE 5 MG/ML INJ: 10.0000 mg | INTRAVENOUS | Status: DC | PRN

## 2020-04-05 MED ORDER — OXYTOCIN-SODIUM CHLORIDE 30-0.9 UT/500ML-% IV SOLN
2.5000 [IU]/h | INTRAVENOUS | Status: DC
  Administered 2020-04-06 (×2): 2.5 [IU]/h via INTRAVENOUS
  Filled 2020-04-05: qty 1000

## 2020-04-05 MED ORDER — LIDOCAINE HCL (PF) 1 % IJ SOLN: 30.0000 mL | INTRAMUSCULAR | Status: DC | PRN

## 2020-04-05 MED ORDER — LIDOCAINE HCL (PF) 1 % IJ SOLN
INTRAMUSCULAR | Status: DC | PRN
  Administered 2020-04-05: 4 mL via SUBCUTANEOUS

## 2020-04-05 MED ORDER — AMMONIA AROMATIC IN INHA
RESPIRATORY_TRACT | Status: AC
  Filled 2020-04-05: qty 10

## 2020-04-05 MED ORDER — ONDANSETRON HCL 4 MG/2ML IJ SOLN: 4.0000 mg | Freq: Four times a day (QID) | INTRAMUSCULAR | Status: DC | PRN

## 2020-04-05 MED ORDER — OXYTOCIN BOLUS FROM INFUSION
333.0000 mL | Freq: Once | INTRAVENOUS | Status: AC
  Administered 2020-04-06: 333 mL via INTRAVENOUS

## 2020-04-05 MED ORDER — TERBUTALINE SULFATE 1 MG/ML IJ SOLN: 0.2500 mg | Freq: Once | INTRAMUSCULAR | Status: DC | PRN

## 2020-04-05 MED ORDER — ACETAMINOPHEN 325 MG PO TABS: 650.0000 mg | ORAL_TABLET | ORAL | Status: DC | PRN

## 2020-04-05 MED ORDER — LIDOCAINE HCL (PF) 1 % IJ SOLN
INTRAMUSCULAR | Status: AC
  Filled 2020-04-05: qty 30

## 2020-04-05 MED ORDER — LACTATED RINGERS IV SOLN
500.0000 mL | INTRAVENOUS | Status: DC | PRN
Start: 2020-04-05 — End: 2020-04-06

## 2020-04-05 MED ORDER — SODIUM CHLORIDE 0.9 % IV SOLN
INTRAVENOUS | Status: DC | PRN
  Administered 2020-04-05 (×2): 5 mL via EPIDURAL

## 2020-04-05 MED ORDER — OXYTOCIN 10 UNIT/ML IJ SOLN
INTRAMUSCULAR | Status: AC
  Filled 2020-04-05: qty 2

## 2020-04-05 MED ORDER — OXYTOCIN-SODIUM CHLORIDE 30-0.9 UT/500ML-% IV SOLN
1.0000 m[IU]/min | INTRAVENOUS | Status: DC
  Administered 2020-04-06: 2 m[IU]/min via INTRAVENOUS

## 2020-04-05 MED ORDER — FENTANYL 2.5 MCG/ML W/ROPIVACAINE 0.15% IN NS 100 ML EPIDURAL (ARMC)
12.0000 mL/h | EPIDURAL | Status: DC
  Administered 2020-04-05: 12 mL/h via EPIDURAL

## 2020-04-05 MED ORDER — MISOPROSTOL 25 MCG QUARTER TABLET
25.0000 ug | ORAL_TABLET | ORAL | Status: DC | PRN
  Administered 2020-04-05 (×2): 25 ug via VAGINAL
  Filled 2020-04-05 (×2): qty 1

## 2020-04-05 MED ORDER — MISOPROSTOL 25 MCG QUARTER TABLET
25.0000 ug | ORAL_TABLET | Freq: Once | ORAL | Status: AC
  Administered 2020-04-05: 25 ug via BUCCAL
  Filled 2020-04-05: qty 1

## 2020-04-05 MED ORDER — DIPHENHYDRAMINE HCL 50 MG/ML IJ SOLN: 12.5000 mg | INTRAMUSCULAR | Status: DC | PRN

## 2020-04-05 MED ORDER — PHENYLEPHRINE 40 MCG/ML (10ML) SYRINGE FOR IV PUSH (FOR BLOOD PRESSURE SUPPORT)
80.0000 ug | PREFILLED_SYRINGE | INTRAVENOUS | Status: DC | PRN
  Filled 2020-04-05: qty 10

## 2020-04-05 MED ORDER — LIDOCAINE-EPINEPHRINE (PF) 1.5 %-1:200000 IJ SOLN
INTRAMUSCULAR | Status: DC | PRN
  Administered 2020-04-05: 3 mL via EPIDURAL

## 2020-04-05 MED ORDER — LACTATED RINGERS IV SOLN: INTRAVENOUS | Status: DC

## 2020-04-05 MED ORDER — BUTORPHANOL TARTRATE 1 MG/ML IJ SOLN
1.0000 mg | INTRAMUSCULAR | Status: DC | PRN
Start: 2020-04-05 — End: 2020-04-06

## 2020-04-05 MED ORDER — MISOPROSTOL 25 MCG QUARTER TABLET
ORAL_TABLET | ORAL | Status: AC
  Filled 2020-04-05: qty 1

## 2020-04-05 MED ORDER — MISOPROSTOL 25 MCG QUARTER TABLET
25.0000 ug | ORAL_TABLET | Freq: Once | ORAL | Status: AC
  Administered 2020-04-05: 25 ug via BUCCAL

## 2020-04-05 MED ORDER — MISOPROSTOL 200 MCG PO TABS
ORAL_TABLET | ORAL | Status: AC
  Filled 2020-04-05: qty 4

## 2020-04-05 MED ORDER — MISOPROSTOL 25 MCG QUARTER TABLET: 25.0000 ug | ORAL_TABLET | Freq: Once | ORAL | Status: DC

## 2020-04-05 NOTE — Anesthesia Preprocedure Evaluation (Signed)
Anesthesia Evaluation  Patient identified by MRN, date of birth, ID band Patient awake    Reviewed: Allergy & Precautions, H&P , NPO status , Patient's Chart, lab work & pertinent test results, reviewed documented beta blocker date and time   History of Anesthesia Complications Negative for: history of anesthetic complications  Airway Mallampati: I  TM Distance: >3 FB Neck ROM: full    Dental no notable dental hx.    Pulmonary neg pulmonary ROS,    Pulmonary exam normal breath sounds clear to auscultation       Cardiovascular Exercise Tolerance: Good negative cardio ROS Normal cardiovascular exam Rhythm:regular Rate:Normal     Neuro/Psych negative neurological ROS  negative psych ROS   GI/Hepatic Neg liver ROS, GERD  ,  Endo/Other  negative endocrine ROS  Renal/GU negative Renal ROS  negative genitourinary   Musculoskeletal   Abdominal   Peds  Hematology negative hematology ROS (+)   Anesthesia Other Findings 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   Reproductive/Obstetrics (+) Pregnancy                             Anesthesia Physical Anesthesia Plan  ASA: II  Anesthesia Plan: Epidural   Post-op Pain Management:    Induction:   PONV Risk Score and Plan:   Airway Management Planned:   Additional Equipment:   Intra-op Plan:   Post-operative Plan:   Informed Consent: I have reviewed the patients History and Physical, chart, labs and discussed the procedure including the risks, benefits and alternatives for the proposed anesthesia with the patient or authorized representative who has indicated his/her understanding and acceptance.     Dental Advisory Given  Plan Discussed with: Anesthesiologist, CRNA and Surgeon  Anesthesia Plan Comments:         Anesthesia Quick Evaluation

## 2020-04-05 NOTE — OB Triage Note (Signed)
[facility] with complaints of LOF. Pt states it began around 0130 and was a cream color. Pt said it saturated a pad in an hour. Pt states positive FM. Monitors applied and assessing.

## 2020-04-05 NOTE — Anesthesia Procedure Notes (Signed)
Epidural Patient location during procedure: OB Start time: 04/05/2020 7:33 PM End time: 04/05/2020 7:37 PM  Staffing Anesthesiologist: Lenard Simmer, MD Performed: anesthesiologist   Preanesthetic Checklist Completed: patient identified, IV checked, site marked, risks and benefits discussed, surgical consent, monitors and equipment checked, pre-op evaluation and timeout performed  Epidural Patient position: sitting Prep: ChloraPrep Patient monitoring: heart rate, continuous pulse ox and blood pressure Approach: midline Location: L3-L4 Injection technique: LOR saline  Needle:  Needle type: Tuohy  Needle gauge: 17 G Needle length: 9 cm and 9 Needle insertion depth: 4 cm Catheter type: closed end flexible Catheter size: 19 Gauge Catheter at skin depth: 9 cm Test dose: negative and 1.5% lidocaine with Epi 1:200 K  Assessment Sensory level: T10 Events: blood not aspirated, injection not painful, no injection resistance, no paresthesia and negative IV test  Additional Notes 1st attempt Pt. Evaluated and documentation done after procedure finished. Patient identified. Risks/Benefits/Options discussed with patient including but not limited to bleeding, infection, nerve damage, paralysis, failed block, incomplete pain control, headache, blood pressure changes, nausea, vomiting, reactions to medication both or allergic, itching and postpartum back pain. Confirmed with bedside nurse the patient's most recent platelet count. Confirmed with patient that they are not currently taking any anticoagulation, have any bleeding history or any family history of bleeding disorders. Patient expressed understanding and wished to proceed. All questions were answered. Sterile technique was used throughout the entire procedure. Please see nursing notes for vital signs. Test dose was given through epidural catheter and negative prior to continuing to dose epidural or start infusion. Warning signs of high  block given to the patient including shortness of breath, tingling/numbness in hands, complete motor block, or any concerning symptoms with instructions to call for help. Patient was given instructions on fall risk and not to get out of bed. All questions and concerns addressed with instructions to call with any issues or inadequate analgesia.   Patient tolerated the insertion well without immediate complications.Reason for block:procedure for pain

## 2020-04-05 NOTE — Progress Notes (Signed)
Cervical exam: 0.5/50/-2, posterior, clear/pink/mucous fluid leaking  Cytotec placed: 25 mcg cervical q 4 hours as needed, 25 mcg buccal once  [name], CNM

## 2020-04-05 NOTE — Progress Notes (Signed)
  Labor Progress Note   29 y.o. G1P0 @ [redacted]w[redacted]d , admitted for  Pregnancy, Labor Management.   Subjective:  PPROM 0330 One dose Cytotec PV and one dose buccal. No ctxs or pain. Continues to leak clear fluid No fever  Objective:  BP 114/76 (BP Location: Left Arm)   Pulse (!) 111   Temp (!) 97.5 F (36.4 C) (Oral)   Resp 16   Ht 5\' 2"  (1.575 m)   Wt 82.8 kg   LMP 07/12/2019   BMI 33.40 kg/m  Abd: gravid, ND, FHT present, mild tenderness on exam Extr: trace to 1+ bilateral pedal edema SVE: CERVIX: 0-1 cm dilated, 40 effaced, -3 (high) station  EFM: FHR: 140 bpm, variability: moderate,  accelerations:  Present,  decelerations:  Absent Toco: None Labs: I have reviewed the patient's lab results.   Assessment & Plan:  G1P0 @ [redacted]w[redacted]d, admitted for  Pregnancy and Labor/Delivery Management  1. Pain management: none. 2. FWB: FHT category 1.  3. ID: GBS negative 4. Labor management: Cytotec second round placed PV and given buccal  All discussed with patient, see orders  [redacted]w[redacted]d, MD, [facility], [facility] 04/05/2020  2:19 PM

## 2020-04-05 NOTE — H&P (Signed)
OB History & Physical   History of Present Illness:  Chief Complaint: Leaking fluid  HPI:  Patricia Ashley is a 29 y.o. G1P0 female at [redacted]w[redacted]d dated by LMP.  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 She had a hymenectomy at 22 weeks.  She denies contractions.   She reports leakage of fluid since 1:30 this morning when she had a gush. She has continued to leak and soak pads since then.   She denies vaginal bleeding.   She reports fetal movement.    Patient prefers female provider at delivery.  Total weight gain for pregnancy: 26.3 kg   Obstetrical Problem List: G1 Problems (from 01/29/20 to present)    Problem Noted Resolved   Supervision of high risk pregnancy, antepartum 02/05/2020 by Nadara Mustard, MD No   Overview Addendum 02/24/2020  2:59 PM by Natale Milch, MD    Clinic [facility] Prenatal Labs  Dating LMP Blood type:   - B+  Genetic Screen 1 Screen:  NEG Antibody: Neg  Anatomic [place] in [place], nml Rubella:   - NonImmune  Varicella:   GTT Third trimester:125  RPR:   - neg Trep test  Rhogam n/a HBsAg:   - NR  TDaP vaccine    02/24/2020           Flu Shot: 01/14/2020 HIV:   - NR  Baby Food   Breast                             GBS:   Contraception  Undecided Pap: will need postpartum  CBB  No   CS/VBAC n/a   Support Person Husband     High Risk Pregnancy Diagnoses Microperforate hymen, 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 Encouraged to maintain current weight as much as feasible.           Previous Version   Encounter for supervision of normal first pregnancy in third trimester 01/29/2020 by Conard Novak, MD 03/30/2020 by Zipporah Plants, CNM   [redacted] weeks gestation of pregnancy 01/29/2020 by Conard Novak, MD 03/29/2020 by Zipporah Plants, CNM       Maternal Medical History:    Past Medical History:  Diagnosis Date  . Latent tuberculosis     Past Surgical History:  Procedure Laterality Date  . PARTIAL HYMENECTOMY      No Known Allergies  Prior to Admission medications   Medication Sig Start Date End Date Taking? Authorizing Provider  Dextromethorphan Polistirex (ROBITUSSIN 12 HOUR COUGH PO) Take by mouth.   Yes [provider]  Prenatal Vit-Fe Fumarate-FA (MULTIVITAMIN-PRENATAL) 27-0.8 MG TABS tablet Take 1 tablet by mouth daily at 12 noon.   Yes [provider]    OB History  Gravida Para Term Preterm AB Living  1            SAB IAB Ectopic Multiple Live Births               # Outcome Date GA Lbr Len/2nd Weight Sex Delivery Anes PTL Lv  1 Current             Prenatal care site: [facility]  Social History: She  reports that she has never smoked. She has never used smokeless tobacco. She reports that she does not drink alcohol and does not use  drugs.  Family History: family history includes Congestive Heart Failure in her mother; Diabetes in her father and mother; Hypertension in her father and mother.    Review of Systems:  Review of Systems  Constitutional: Negative for chills and fever.  HENT: Negative for congestion, ear discharge, ear pain, hearing loss, sinus pain and sore throat.   Eyes: Negative for blurred vision and double vision.  Respiratory: Negative for cough, shortness of breath and wheezing.   Cardiovascular: Negative for chest pain, palpitations and leg swelling.  Gastrointestinal: Negative for abdominal pain, blood in stool, constipation, diarrhea, heartburn, melena, nausea and vomiting.  Genitourinary: Negative for dysuria, flank pain, frequency, hematuria and urgency.       Positive for leaking fluid  Musculoskeletal: Positive for back pain. Negative for joint pain and myalgias.  Skin: Negative for itching and rash.  Neurological: Negative for dizziness, tingling, tremors, sensory change, speech  change, focal weakness, seizures, loss of consciousness, weakness and headaches.  Endo/Heme/Allergies: Negative for environmental allergies. Does not bruise/bleed easily.  Psychiatric/Behavioral: Negative for depression, hallucinations, memory loss, substance abuse and suicidal ideas. The patient is not nervous/anxious and does not have insomnia.      Physical Exam:  BP 115/75 (BP Location: Right Arm)   Pulse 95   Temp 97.6 F (36.4 C) (Oral)   Resp 16   LMP 07/12/2019   Constitutional: Well nourished, well developed female in no acute distress.  HEENT: normal Skin: Warm and dry.  Cardiovascular: Regular rate and rhythm.   Extremity: no edema  Respiratory: Clear to auscultation bilateral. Normal respiratory effort Abdomen: FHT present Back: no CVAT Neuro: DTRs 2+, Cranial nerves grossly intact Psych: Alert and Oriented x3. No memory deficits. Normal mood and affect.  MS: normal gait, normal bilateral lower extremity ROM/strength/stability.  Pelvic exam: per RN   Baseline FHR: 135 beats/min   Variability: moderate   Accelerations: present   Decelerations: absent Contractions: absent frequency: uterine irritability q 3 minutes Overall assessment: reassuring  Results of Covid swab pending No results found for: SARSCOV2NAA]  Assessment:  Patricia Ashley is a 29 y.o. G1P0 female at [redacted]w[redacted]d with PROM.   Plan:  1. Admit to Labor & Delivery  2. CBC, T&S, Clrs, IVF 3. GBS negative.   4. Fetal well-being: Category I 5. Begin induction with cytotec: 25 mcg cervical, 25 mcg buccal   [name], CNM 04/05/2020 7:55 AM

## 2020-04-06 ENCOUNTER — Encounter: Payer: Self-pay | Admitting: Obstetrics and Gynecology

## 2020-04-06 DIAGNOSIS — O4292 Full-term premature rupture of membranes, unspecified as to length of time between rupture and onset of labor: Secondary | ICD-10-CM | POA: Diagnosis not present

## 2020-04-06 DIAGNOSIS — E669 Obesity, unspecified: Secondary | ICD-10-CM

## 2020-04-06 DIAGNOSIS — Z3A38 38 weeks gestation of pregnancy: Secondary | ICD-10-CM

## 2020-04-06 DIAGNOSIS — O99214 Obesity complicating childbirth: Secondary | ICD-10-CM

## 2020-04-06 LAB — CBC
HCT: 34.7 % — ABNORMAL LOW (ref 36.0–46.0)
Hemoglobin: 12.1 g/dL (ref 12.0–15.0)
MCH: 32.2 pg (ref 26.0–34.0)
MCHC: 34.9 g/dL (ref 30.0–36.0)
MCV: 92.3 fL (ref 80.0–100.0)
Platelets: 182 10*3/uL (ref 150–400)
RBC: 3.76 MIL/uL — ABNORMAL LOW (ref 3.87–5.11)
RDW: 14 % (ref 11.5–15.5)
WBC: 17.3 10*3/uL — ABNORMAL HIGH (ref 4.0–10.5)
nRBC: 0 % (ref 0.0–0.2)

## 2020-04-06 LAB — RPR: RPR Ser Ql: NONREACTIVE

## 2020-04-06 MED ORDER — COCONUT OIL OIL
1.0000 "application " | TOPICAL_OIL | Status: DC | PRN
  Filled 2020-04-06: qty 120

## 2020-04-06 MED ORDER — ACETAMINOPHEN 325 MG PO TABS
650.0000 mg | ORAL_TABLET | ORAL | Status: DC | PRN
Start: 2020-04-06 — End: 2020-04-07
  Administered 2020-04-06 – 2020-04-07 (×2): 650 mg via ORAL
  Filled 2020-04-06 (×2): qty 2

## 2020-04-06 MED ORDER — SENNOSIDES-DOCUSATE SODIUM 8.6-50 MG PO TABS
2.0000 | ORAL_TABLET | ORAL | Status: DC
  Administered 2020-04-06: 2 via ORAL
  Filled 2020-04-06 (×2): qty 2

## 2020-04-06 MED ORDER — IBUPROFEN 600 MG PO TABS
600.0000 mg | ORAL_TABLET | Freq: Four times a day (QID) | ORAL | Status: DC
  Administered 2020-04-06 – 2020-04-07 (×5): 600 mg via ORAL
  Filled 2020-04-06 (×7): qty 1

## 2020-04-06 MED ORDER — ONDANSETRON HCL 4 MG/2ML IJ SOLN: 4.0000 mg | INTRAMUSCULAR | Status: DC | PRN

## 2020-04-06 MED ORDER — SIMETHICONE 80 MG PO CHEW
80.0000 mg | CHEWABLE_TABLET | ORAL | Status: DC | PRN
Start: 2020-04-06 — End: 2020-04-07

## 2020-04-06 MED ORDER — PRENATAL MULTIVITAMIN CH
1.0000 | ORAL_TABLET | Freq: Every day | ORAL | Status: DC
  Administered 2020-04-06 – 2020-04-07 (×2): 1 via ORAL
  Filled 2020-04-06 (×2): qty 1

## 2020-04-06 MED ORDER — ONDANSETRON HCL 4 MG PO TABS
4.0000 mg | ORAL_TABLET | ORAL | Status: DC | PRN
Start: 2020-04-06 — End: 2020-04-07

## 2020-04-06 MED ORDER — DIPHENHYDRAMINE HCL 25 MG PO CAPS: 25.0000 mg | ORAL_CAPSULE | Freq: Four times a day (QID) | ORAL | Status: DC | PRN

## 2020-04-06 MED ORDER — WITCH HAZEL-GLYCERIN EX PADS: 1.0000 "application " | MEDICATED_PAD | CUTANEOUS | Status: DC | PRN

## 2020-04-06 MED ORDER — FERROUS SULFATE 325 (65 FE) MG PO TABS
325.0000 mg | ORAL_TABLET | Freq: Two times a day (BID) | ORAL | Status: DC
  Administered 2020-04-06 – 2020-04-07 (×2): 325 mg via ORAL
  Filled 2020-04-06 (×2): qty 1

## 2020-04-06 MED ORDER — BENZOCAINE-MENTHOL 20-0.5 % EX AERO
1.0000 "application " | INHALATION_SPRAY | CUTANEOUS | Status: DC | PRN
  Administered 2020-04-06: 1 via TOPICAL
  Filled 2020-04-06: qty 56

## 2020-04-06 MED ORDER — DIBUCAINE (PERIANAL) 1 % EX OINT: 1.0000 "application " | TOPICAL_OINTMENT | CUTANEOUS | Status: DC | PRN

## 2020-04-06 MED ORDER — MEASLES, MUMPS & RUBELLA VAC IJ SOLR
0.5000 mL | Freq: Once | INTRAMUSCULAR | Status: DC
  Filled 2020-04-06 (×2): qty 0.5

## 2020-04-06 NOTE — Lactation Note (Signed)
This note was copied from a baby's chart. Lactation Consultation Note  Patient Name: Patricia Ashley Today's Date: 04/06/2020 Reason for consult: Initial assessment;1st time breastfeeding;Early term 37-38.6wks Age:29 hours  Initial lactation visit. Mom is G1P1 SVD 5hrs ago. Feedings noted in computer after delivery, no output yet. Baby is sleeping soundly in bassinet. LC began to provide BF basics education when baby showed signs of spitting up, turned to side and helped suction- large emesis- clear fluid. LC discussed with parents the normalcy of this, how to assist baby with fluid removal and how this may impact her desire to eat. Encouraged to attempt feedings every 3-4 hours, skin to skin with baby, and hand expression throughout the day. LC demonstrated hand expression- LC notes clear drops visible. LC reviewed BF basics: newborn stomach size, feeding patterns/behaviors, early cues, and output expectations in first 24hrs. LC name/number written on whiteboard and encourage parents to call with next feeding attempt for assistance.   Maternal Data Formula Feeding for Exclusion: No Has patient been taught Hand Expression?: Yes Does the patient have breastfeeding experience prior to this delivery?: No  Feeding Feeding Type: Breast Fed  LATCH Score                   Interventions Interventions: Breast feeding basics reviewed  Lactation Tools Discussed/Used     Consult Status Consult Status: Follow-up Date: 04/06/20 Follow-up type: In-patient    Danford Bad 04/06/2020, 9:10 AM

## 2020-04-06 NOTE — Discharge Summary (Signed)
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 yo G1P0 at 362w3das admitted in Latent Labor on 04/05/2020. Patient had an uncomplicated labor course as follows:  Membrane Rupture Time/Date: 1:30 AM ,04/05/2020   Delivery Method:Vaginal, Spontaneous  Episiotomy: None  Lacerations:  2nd degree;Perineal  Patient had an uncomplicated postpartum course.  She is ambulating, tolerating a regular diet, passing flatus, and urinating well. Patient is discharged home in stable condition on 04/07/20.  Newborn Data: Birth date:04/06/2020  Birth time:3:52 AM  Gender:Female  Living status:Living  Apgars:9 ,10  Weight:3300 g   Magnesium Sulfate received: No BMZ received: No Rhophylac:No  MMR:Yes T-DaP:Given prenatally - 02/24/20 Flu: Given prenatally - 01/14/20 Transfusion:No  Physical exam  Vitals:   04/06/20 1540 04/06/20 2312 04/06/20 2315 04/07/20 0744  BP: 114/81 115/89  110/89  Pulse: 99 (!) 124 (!) 115 98  Resp: 18 18  18   Temp: 97.8 F (36.6 C) 98.2 F (36.8 C)  97.9 F (36.6 C)  TempSrc: Oral Oral  Oral  SpO2:  97%  100%  Weight:       Height:       General: alert, cooperative and no distress Lochia: appropriate Uterine Fundus: firm Incision: N/A DVT Evaluation: No evidence of DVT seen on physical exam. No significant calf/ankle edema. Labs: Lab Results  Component Value Date   WBC 14.0 (H) 04/07/2020   HGB 10.7 (L) 04/07/2020   HCT 31.4 (L) 04/07/2020   MCV 93.2 04/07/2020   PLT 184 04/07/2020   No flowsheet data found. Edinburgh Score: No flowsheet data found.    After visit meds:  Allergies as of 04/07/2020   No Known Allergies     Medication List    STOP taking these medications   ROBITUSSIN 12 HOUR COUGH PO     TAKE these medications   acetaminophen 325 MG tablet Commonly known as: Tylenol Take 2 tablets (650 mg total) by mouth every 4 (four) hours as needed (for pain scale < 4).   ferrous sulfate 325 (65 FE) MG tablet Take 1 tablet (325 mg total) by mouth every other day.   ibuprofen 600 MG tablet Commonly known as: ADVIL Take 1 tablet (600 mg total) by mouth every 6 (six) hours.   multivitamin-prenatal 27-0.8 MG Tabs tablet Take 1 tablet by mouth daily at 12 noon.        Discharge home in stable condition Infant Feeding: breast feeding with formula supplementation Infant Disposition:home with mother Discharge instruction: per After Visit Summary and Postpartum booklet. Activity: Advance  as tolerated. Pelvic rest for 6 weeks.  Diet: routine diet Anticipated Birth Control: Unsure - reviewed options with patient, provided resources for patient to review prior to 6 week PP visit Postpartum Appointment:6 weeks Additional Postpartum F/U: n/a Future Appointments: Future Appointments  Date Time Provider [place]  04/14/2020  3:10 PM Imagene Riches, CNM [facility] None   Follow up Visit:  Follow-up Information    Will Bonnet, MD. Schedule an appointment as soon as possible for a visit in 6 week(s).   Specialty: Obstetrics and Gynecology Why: Six weeks postpartum  visit Contact information: [address] [place] [address] [phone]              SIGNED:  Orlie Pollen, CNM, MSN 04/07/20

## 2020-04-06 NOTE — Lactation Note (Signed)
This note was copied from a baby's chart. Lactation Consultation Note  Patient Name: Girl Cathi Mo Today's Date: 04/06/2020 Reason for consult: Follow-up assessment;Mother's request;1st time breastfeeding;Early term 37-38.6wks Age:29 hours  Lactation has been present in room several times throughout the day as baby has been spitty and has several emesis episodes. Parents have been worried, but reassured several times that this can be normal for some baby's and continue to use suction bulb as needed to help baby. Parents were also encouraged to keep baby upright, to help with baby's discomfort.  LC assisted with feeding this afternoon, provided a 75mm NS and baby latched well and easily and maintained latch at the breast, a few audible swallows were heard, and baby was continuing to nurse well. Parents asked questions about colostrum, milk and formula. LC educated parents on early colostrum, infants stomach size, impact of colostrum, milk supply and demand, normal course of lactation. LC also cautioned against formula introduction and the impact that this can have on establishing breastfeeding.  LC attempted to provide reassurance that baby was doing well at the breast, we have had a stool diaper so far, and 12 more hours for a wet diaper, reminded them of feeding patterns in the first 24 hours, and other signs that baby is doing well; encouraged to hold off on formula introduction/bottle feeding for now. Parents agreeable.   Maternal Data Formula Feeding for Exclusion: No Has patient been taught Hand Expression?: Yes Does the patient have breastfeeding experience prior to this delivery?: No  Feeding Feeding Type: Breast Fed  LATCH Score Latch: Grasps breast easily, tongue down, lips flanged, rhythmical sucking.  Audible Swallowing: A few with stimulation  Type of Nipple: Everted at rest and after stimulation  Comfort (Breast/Nipple): Filling, red/small blisters or bruises,  mild/mod discomfort (discomfort per mom)  Hold (Positioning): Assistance needed to correctly position infant at breast and maintain latch.  LATCH Score: 7  Interventions Interventions: Breast feeding basics reviewed;Assisted with latch;Hand express;Breast compression;Adjust position;Support pillows  Lactation Tools Discussed/Used Tools: Nipple Shields Nipple shield size: 16   Consult Status Consult Status: Follow-up Date: 04/06/20 Follow-up type: Call as needed    Danford Bad 04/06/2020, 4:14 PM

## 2020-04-06 NOTE — Lactation Note (Signed)
This note was copied from a baby's chart. Lactation Consultation Note  Patient Name: Patricia Ashley Today's Date: 04/06/2020 Reason for consult: Follow-up assessment;Mother's request;1st time breastfeeding;Early term 37-38.6wks Age:29 hours  Lactation in per mom's request for assistance with breastfeeding attempt. Baby had stool diaper in which LC coached mom through changing and assisted as needed. LC assisted with getting mom into comfortable position with support pillows for cradle hold for L breast. Baby brought to breast without blankets, wide open mouth and latched easily, however mom reports "strong suck", baby fell into a rhythmic sucking pattern and mom seemed to relax. Flanged lips top and bottom, wide mouth, no dimpling of the cheeks noted. Family member in room covered both mom and baby up, including the breast tissue once baby began to eat. Shortly after feeding started family member reported blood coming out of breast. LC taught and demonstrated hand expression, clear colostrum visible; no blood or signs of nipple damage noted. LC assisted with attempted feed on opposite breast, baby did latch, mom again noting discomfort, but baby did not stay at the breast and fell asleep quickly, nipple remained round. LC encouraged hand expression on nipples to aid in soreness, importance of positioning and wide open mouth, and keeping baby awake.  Next option will be use of nipple shield, size 20mm NS would fit best, to aid in feedings and minimize discomfort. Mom will call with next feeding.  Maternal Data Formula Feeding for Exclusion: No Has patient been taught Hand Expression?: Yes Does the patient have breastfeeding experience prior to this delivery?: No  Feeding Feeding Type: Breast Fed  LATCH Score Latch: Grasps breast easily, tongue down, lips flanged, rhythmical sucking.  Audible Swallowing: A few with stimulation  Type of Nipple: Everted at rest and after  stimulation  Comfort (Breast/Nipple): Soft / non-tender  Hold (Positioning): Assistance needed to correctly position infant at breast and maintain latch.  LATCH Score: 8  Interventions Interventions: Breast feeding basics reviewed;Assisted with latch;Breast massage;Hand express;Adjust position;Support pillows  Lactation Tools Discussed/Used     Consult Status Consult Status: Follow-up Date: 04/06/20 Follow-up type: Call as needed    Danford Bad 04/06/2020, 12:47 PM

## 2020-04-07 ENCOUNTER — Encounter: Payer: 59 | Admitting: Advanced Practice Midwife

## 2020-04-07 LAB — CBC
HCT: 31.4 % — ABNORMAL LOW (ref 36.0–46.0)
Hemoglobin: 10.7 g/dL — ABNORMAL LOW (ref 12.0–15.0)
MCH: 31.8 pg (ref 26.0–34.0)
MCHC: 34.1 g/dL (ref 30.0–36.0)
MCV: 93.2 fL (ref 80.0–100.0)
Platelets: 184 10*3/uL (ref 150–400)
RBC: 3.37 MIL/uL — ABNORMAL LOW (ref 3.87–5.11)
RDW: 14.2 % (ref 11.5–15.5)
WBC: 14 10*3/uL — ABNORMAL HIGH (ref 4.0–10.5)
nRBC: 0 % (ref 0.0–0.2)

## 2020-04-07 MED ORDER — ACETAMINOPHEN 325 MG PO TABS: 650.0000 mg | ORAL_TABLET | ORAL | Status: DC | PRN

## 2020-04-07 MED ORDER — FERROUS SULFATE 325 (65 FE) MG PO TABS: 325.0000 mg | ORAL_TABLET | ORAL | 3 refills | Status: DC

## 2020-04-07 MED ORDER — IBUPROFEN 600 MG PO TABS: 600.0000 mg | ORAL_TABLET | Freq: Four times a day (QID) | ORAL | 0 refills | Status: DC

## 2020-04-07 NOTE — Progress Notes (Signed)
RN called to room; father of baby holding fussy baby at one side of bed; pt standing at bedside on other side of bed; father of baby says to RN "she is so hungry, her stomach is empty, she is crying and she (mom) has no milk"; pt then says "I have no milk"; RN offered to assist with breastfeeding, help get baby latched with nipple shield and see how the feeding goes; father of baby says again "she is hungry and crying and her stomach is empty and she (mom) has no milk"; RN discussed colostrum; pt then opened gown and said "see, I have no milk"; RN assisted pt with hand expression and colostrum seen (RN pointed it out to pt); pt did seem that hand expression was painful (she grimaced); both parents continue to say the baby is hungry and pt has no milk; RN offered options to assist:   1)  RN can assist with breastfeeding/latching 2)  Pt can pump 3)  They can supplement with formula 4)  They can supplement with donor breast milk  RN did lots of education on each option; parents looked at RN and at times just repeated how hungry the baby was; RN re-explained options and encouraged the parents to decide what they would like to do; pt said formula & dad said pump; pt did decide to pump first and see if any colostrum can be collected; after 15 min of pumping, no colostrum collected, parents used similac to feed baby; RN educated use of formula as supplement; RN encouraged parents to get to each feeding when it comes and decide what will work for them for that feeding; father of baby kept asking "will we need to use formula in 3 days" and "will we need to keep using that breast pump in 3 days"; RN educated that mature milk comes in in 3-5 days; RN also educated parents that in order for mature milk to be produced, the breasts must be stimulated by either the baby or the breast pump; RN did also tell parents that the baby does better at the breast than the pump initially at getting colostrum out; RN explained to  parents not to get discouraged if no colostrum is seen while pumping; RN reminded parents that pt does have colostrum (we did see it during hand expression); also, LC needs to make sure pt has access to breast pump at home

## 2020-04-07 NOTE — Progress Notes (Signed)
Pt discharged with infant.  Discharge instructions, prescriptions and follow up appointment given to and reviewed with pt. Pt verbalized understanding. Escorted out by auxillary. 

## 2020-04-07 NOTE — Lactation Note (Signed)
This note was copied from a baby's chart. Lactation Consultation Note  Patient Name: Patricia Ashley Today's Date: 04/07/2020 Reason for consult: Follow-up assessment;Mother's request;1st time breastfeeding;Early term 37-38.6wks Age:29 years  Lactation follow-up prior to discharge. Parents felt overnight that baby was hungry and not able to be satisfied at the breast. RN provided thorough education re: feeding behaviors, early cues, growth spurts, putting to breast, and offered alternatives.  This morning LC assisted with latch of baby at the breast w/ nipple shield. Baby grasped the breast easily, improved wide open mouth with flanged top/bottom lips, and had strong rhythmic sucking pattern with audible swallows. The sounds and jaw movement during swallows were pointed out to the parents. Mom reporting a tugging sensation as painful, LC attempted to distinguish between painful or strong, unable to determine. Parents concerned about feeding once they are discharged, and continued to ask questions about use of formula and onset of mom's milk. LC re-educated parents on the normal course of lactation, the colostrum volume and baby's stomach size, importance of on demand feedings to encouraged adequate supply and the impact that formula may have on establishing a full milk supply for baby. LC re-educated on cluster feeding and why this happens and the importance of putting to breast with all cues, and how this impacts milk supply as well.  After re-education completed feeding plan determined to be (in efforts to provide reassurance to parents): Breastfeed baby and follow up with formula as needed based on baby's behaviors after time at the breast. Parents given outpatient lactation information and services and community breastfeeding support information. Encouraged to seek ongoing BF support as needed.   Maternal Data Formula Feeding for Exclusion: No Has patient been taught Hand Expression?:  Yes Does the patient have breastfeeding experience prior to this delivery?: No  Feeding Feeding Type: Breast Fed  LATCH Score Latch: Grasps breast easily, tongue down, lips flanged, rhythmical sucking.  Audible Swallowing: Spontaneous and intermittent  Type of Nipple: Everted at rest and after stimulation  Comfort (Breast/Nipple): Filling, red/small blisters or bruises, mild/mod discomfort (per mom, nothing visible)  Hold (Positioning): Assistance needed to correctly position infant at breast and maintain latch.  LATCH Score: 8  Interventions Interventions: Breast feeding basics reviewed;Assisted with latch;Adjust position;Support pillows  Lactation Tools Discussed/Used Pump Education: Other (comment) (set-up overnight)   Consult Status Consult Status: Complete Date: 04/07/20 Follow-up type: Call as needed    Danford Bad 04/07/2020, 10:37 AM

## 2020-04-07 NOTE — Anesthesia Postprocedure Evaluation (Signed)
Anesthesia Post Note  Patient: Patricia Ashley  Procedure(s) Performed: AN AD HOC LABOR EPIDURAL  Patient location during evaluation: Mother Baby Anesthesia Type: Epidural Level of consciousness: awake and alert Pain management: pain level controlled Vital Signs Assessment: post-procedure vital signs reviewed and stable Respiratory status: spontaneous breathing, nonlabored ventilation and respiratory function stable Cardiovascular status: stable Postop Assessment: no headache, no backache and epidural receding Anesthetic complications: no   No complications documented.   Last Vitals:  Vitals:   04/06/20 2315 04/07/20 0744  BP:  110/89  Pulse: (!) 115 98  Resp:  18  Temp:  36.6 C  SpO2:  100%    Last Pain:  Vitals:   04/07/20 0744  TempSrc: Oral  PainSc:                  Jeanine Luz

## 2020-04-14 ENCOUNTER — Encounter: Payer: 59 | Admitting: Obstetrics

## 2020-04-21 ENCOUNTER — Telehealth: Payer: Self-pay

## 2020-04-21 NOTE — Telephone Encounter (Signed)
Husband reports patient is having body pains and lightheaded. She also has a cough. Inquiring if ok to take cough meds. JS#[phone]

## 2020-04-21 NOTE — Telephone Encounter (Signed)
LMVM TRC. Advised no DPR on File, can only speak to patient.

## 2020-04-22 ENCOUNTER — Other Ambulatory Visit: Payer: Self-pay

## 2020-04-22 ENCOUNTER — Encounter: Payer: Self-pay | Admitting: Obstetrics and Gynecology

## 2020-04-22 ENCOUNTER — Ambulatory Visit (INDEPENDENT_AMBULATORY_CARE_PROVIDER_SITE_OTHER): Payer: 59 | Admitting: Obstetrics and Gynecology

## 2020-04-22 NOTE — Progress Notes (Signed)
Obstetrics & Gynecology Office Visit   Chief Complaint  Patient presents with  . Postpartum Care   History of Present Illness: 30 y.o. G37P1001 female who is postpartum day #16 after an NSVD that was uncomplicated.   She has had normal lochia since delivery. For the past two days she noticed bleeding from the area where she had the perineal laceration.  She notes no increase in pain. She has not had intercourse.    Past Medical History:  Diagnosis Date  . 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 PARTIAL HYMENECTOMY      Gynecologic History: No LMP recorded.  Obstetric History: G1P1001  Family History  Problem Relation Age of Onset  . Congestive Heart Failure Mother   . Diabetes Mother   . Hypertension Mother   . Diabetes Father   . Hypertension Father     Social History   Socioeconomic History  . Marital status: Married    Spouse name: Not on file  . Number of children: Not on file  . Years of education: Not on file  . Highest education level: Not on file  Occupational History  . Not on file  Tobacco Use  . Smoking status: Never Smoker  . Smokeless tobacco: Never Used  Vaping Use  . Vaping Use: Never used  Substance and Sexual Activity  . Alcohol use: Never  . Drug use: Never  . Sexual activity: Not Currently  Other Topics Concern  . Not on file  Social History Narrative  . Not on file   Social Determinants of Health   Financial Resource Strain: Not on file  Food Insecurity: Not on file  Transportation Needs: Not on file  Physical Activity: Not on file  Stress: Not on file  Social Connections: Not on file  Intimate Partner Violence: Not on file    No Known Allergies  Prior to Admission medications   Medication Sig Start Date End Date Taking? Authorizing Provider  acetaminophen (TYLENOL) 325 MG tablet Take 2 tablets (650 mg total) by mouth every 4 (four) hours as needed (for pain scale < 4). 04/07/20    Zipporah Plants, CNM  ferrous sulfate 325 (65 FE) MG tablet Take 1 tablet (325 mg total) by mouth every other day. 04/07/20   Zipporah Plants, CNM  ibuprofen (ADVIL) 600 MG tablet Take 1 tablet (600 mg total) by mouth every 6 (six) hours. 04/07/20   Zipporah Plants, CNM  Prenatal Vit-Fe Fumarate-FA (MULTIVITAMIN-PRENATAL) 27-0.8 MG TABS tablet Take 1 tablet by mouth daily at 12 noon.    [provider]    Review of Systems  Constitutional: Negative.   HENT: Negative.   Eyes: Negative.   Respiratory: Negative.   Cardiovascular: Negative.   Gastrointestinal: Negative.   Genitourinary: Negative.   Musculoskeletal: Negative.   Skin: Negative.   Neurological: Negative.   Psychiatric/Behavioral: Negative.      Physical Exam BP 126/84   Wt 162 lb (73.5 kg)   BMI 29.63 kg/m  No LMP recorded. Physical Exam Constitutional:      General: She is not in acute distress.    Appearance: Normal appearance.  Genitourinary:     Genitourinary Comments: Incision line is clean, dry, intact. There is no erythema, induration, warmth, tenderness. No visible suture.  Suture palpated within vagina just proximal to hymenal ring. This is the knot where the suture was tied during repair.   HENT:     Head: Normocephalic and atraumatic.  Eyes:     General: No scleral icterus.    Conjunctiva/sclera: Conjunctivae normal.  Neurological:     General: No focal deficit present.     Mental Status: She is alert and oriented to person, place, and time.     Cranial Nerves: No cranial nerve deficit.  Psychiatric:        Mood and Affect: Mood normal.        Behavior: Behavior normal.        Judgment: Judgment normal.    Female chaperone present for pelvic and breast  portions of the physical exam  Assessment: 30 y.o. G1P1001 female here for  1. Postpartum care and examination      Plan: Problem List Items Addressed This Visit   None   Visit Diagnoses    Postpartum care and examination    -  Primary      Pt reassured. Follow up in 4 weeks for routine 6 weeks postpartum   Thomasene Mohair, MD 04/22/2020 4:17 PM

## 2020-05-02 ENCOUNTER — Telehealth: Payer: Self-pay

## 2020-05-02 NOTE — Telephone Encounter (Signed)
Pt's [facility] calling; pt had mastitis; went to urgent care; rx'd dicloxacillin 250mg ; has finished antibx; still has clogged milk ducts.  What to do/next steps?  [phone]

## 2020-05-03 ENCOUNTER — Encounter: Payer: Self-pay | Admitting: Obstetrics & Gynecology

## 2020-05-03 ENCOUNTER — Ambulatory Visit (INDEPENDENT_AMBULATORY_CARE_PROVIDER_SITE_OTHER): Payer: 59 | Admitting: Obstetrics and Gynecology

## 2020-05-03 ENCOUNTER — Encounter: Payer: Self-pay | Admitting: Obstetrics and Gynecology

## 2020-05-03 ENCOUNTER — Inpatient Hospital Stay
Admission: AD | Admit: 2020-05-03 | Discharge: 2020-05-08 | DRG: 769 | Disposition: A | Discharge status: Home / Self Care | Payer: 59 | Attending: Obstetrics & Gynecology | Admitting: Obstetrics & Gynecology

## 2020-05-03 ENCOUNTER — Other Ambulatory Visit: Payer: Self-pay

## 2020-05-03 VITALS — BP 100/62 | Ht 62.0 in | Wt 160.0 lb

## 2020-05-03 DIAGNOSIS — Z8615 Personal history of latent tuberculosis infection: Secondary | ICD-10-CM | POA: Diagnosis not present

## 2020-05-03 DIAGNOSIS — N611 Abscess of the breast and nipple: Secondary | ICD-10-CM

## 2020-05-03 DIAGNOSIS — Z20822 Contact with and (suspected) exposure to covid-19: Secondary | ICD-10-CM | POA: Diagnosis present

## 2020-05-03 DIAGNOSIS — Z22322 Carrier or suspected carrier of Methicillin resistant Staphylococcus aureus: Secondary | ICD-10-CM

## 2020-05-03 DIAGNOSIS — O9112 Abscess of breast associated with the puerperium: Secondary | ICD-10-CM | POA: Diagnosis present

## 2020-05-03 DIAGNOSIS — N63 Unspecified lump in unspecified breast: Secondary | ICD-10-CM

## 2020-05-03 DIAGNOSIS — N61 Mastitis without abscess: Secondary | ICD-10-CM | POA: Diagnosis not present

## 2020-05-03 DIAGNOSIS — O9113 Abscess of breast associated with lactation: Secondary | ICD-10-CM | POA: Diagnosis not present

## 2020-05-03 DIAGNOSIS — N644 Mastodynia: Secondary | ICD-10-CM | POA: Diagnosis present

## 2020-05-03 DIAGNOSIS — B958 Unspecified staphylococcus as the cause of diseases classified elsewhere: Secondary | ICD-10-CM | POA: Diagnosis not present

## 2020-05-03 DIAGNOSIS — B9562 Methicillin resistant Staphylococcus aureus infection as the cause of diseases classified elsewhere: Secondary | ICD-10-CM | POA: Diagnosis not present

## 2020-05-03 LAB — CBC WITH DIFFERENTIAL/PLATELET
Abs Immature Granulocytes: 0.13 10*3/uL — ABNORMAL HIGH (ref 0.00–0.07)
Basophils Absolute: 0 10*3/uL (ref 0.0–0.1)
Basophils Relative: 0 %
Eosinophils Absolute: 0.5 10*3/uL (ref 0.0–0.5)
Eosinophils Relative: 3 %
HCT: 40.7 % (ref 36.0–46.0)
Hemoglobin: 13.7 g/dL (ref 12.0–15.0)
Immature Granulocytes: 1 %
Lymphocytes Relative: 15 %
Lymphs Abs: 2 10*3/uL (ref 0.7–4.0)
MCH: 30.2 pg (ref 26.0–34.0)
MCHC: 33.7 g/dL (ref 30.0–36.0)
MCV: 89.6 fL (ref 80.0–100.0)
Monocytes Absolute: 0.6 10*3/uL (ref 0.1–1.0)
Monocytes Relative: 5 %
Neutro Abs: 10.6 10*3/uL — ABNORMAL HIGH (ref 1.7–7.7)
Neutrophils Relative %: 76 %
Platelets: 496 10*3/uL — ABNORMAL HIGH (ref 150–400)
RBC: 4.54 MIL/uL (ref 3.87–5.11)
RDW: 12.5 % (ref 11.5–15.5)
WBC: 13.9 10*3/uL — ABNORMAL HIGH (ref 4.0–10.5)
nRBC: 0.1 % (ref 0.0–0.2)

## 2020-05-03 LAB — BASIC METABOLIC PANEL
Anion gap: 13 (ref 5–15)
BUN: 12 mg/dL (ref 6–20)
CO2: 25 mmol/L (ref 22–32)
Calcium: 9.9 mg/dL (ref 8.9–10.3)
Chloride: 100 mmol/L (ref 98–111)
Creatinine, Ser: 0.72 mg/dL (ref 0.44–1.00)
GFR, Estimated: >60 mL/min (ref >60)
Glucose, Bld: 111 mg/dL — ABNORMAL HIGH (ref 70–99)
Potassium: 4 mmol/L (ref 3.5–5.1)
Sodium: 138 mmol/L (ref 135–145)

## 2020-05-03 MED ORDER — ACETAMINOPHEN 325 MG PO TABS
650.0000 mg | ORAL_TABLET | ORAL | Status: DC | PRN
  Administered 2020-05-04 – 2020-05-07 (×3): 650 mg via ORAL
  Filled 2020-05-03 (×3): qty 2

## 2020-05-03 MED ORDER — ALUM & MAG HYDROXIDE-SIMETH 200-200-20 MG/5ML PO SUSP: 30.0000 mL | ORAL | Status: DC | PRN

## 2020-05-03 MED ORDER — PRENATAL 27-0.8 MG PO TABS
1.0000 | ORAL_TABLET | Freq: Every day | ORAL | Status: DC
  Filled 2020-05-03 (×6): qty 1

## 2020-05-03 MED ORDER — SODIUM CHLORIDE 0.9 % IV SOLN
3.0000 g | Freq: Four times a day (QID) | INTRAVENOUS | Status: DC
  Administered 2020-05-03 – 2020-05-04 (×3): 3 g via INTRAVENOUS
  Filled 2020-05-03 (×2): qty 3
  Filled 2020-05-03: qty 8
  Filled 2020-05-03: qty 3

## 2020-05-03 MED ORDER — FLUCONAZOLE 50 MG PO TABS
150.0000 mg | ORAL_TABLET | Freq: Once | ORAL | Status: AC
  Administered 2020-05-03: 150 mg via ORAL
  Filled 2020-05-03: qty 1

## 2020-05-03 MED ORDER — SIMETHICONE 80 MG PO CHEW: 80.0000 mg | CHEWABLE_TABLET | Freq: Four times a day (QID) | ORAL | Status: DC | PRN

## 2020-05-03 MED ORDER — LACTATED RINGERS IV SOLN: INTRAVENOUS | Status: DC

## 2020-05-03 MED ORDER — OXYCODONE-ACETAMINOPHEN 5-325 MG PO TABS: 1.0000 | ORAL_TABLET | ORAL | Status: DC | PRN

## 2020-05-03 NOTE — Progress Notes (Signed)
Postpartum Visit  Chief Complaint:  Chief Complaint  Patient presents with  . pain with breastfeeding    History of Present Illness: Patient is a 30 y.o. G1P1001 presents for a problem visit. Patient reports being seen and treated on 04/23/20 for mastitis at urgent care. Patient reports that for three days prior to that visit she was experiencing significant body aches, headaches, and fever. Patient states these sx resolved when she was rx'd and taking dicloxacillin for 48 hours. Today patient presents after finishing her course of dicloxacillin (500 mg 4 times daily) yesterday. Patient reports continued breast pain, clogged duct, but denies systemic sx of infection including fever, body aches or headache. Patient is exclusively breastfeeding every 1-2 hours.  Date of delivery: 04/06/20 Type of delivery: Vaginal delivery - Vacuum or forceps assisted  no Episiotomy No.  Laceration: yes  Pregnancy or labor problems:  no Any problems since the delivery:  Yes - see HPI Newborn Details:  SINGLETON :  1. BabyGender female. Birth weight:  3300g Maternal Details:  Breast or formula feeding: currently breastfeeding Intercourse: No  Contraception after delivery: unsure Any bowel or bladder issues: No  Post partum depression/anxiety noted:  no [place] Post-Partum Depression Score: n/a  Review of Systems: Review of Systems  All other systems reviewed and are negative.  Past Medical History:  Past Medical History:  Diagnosis Date  . 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 PARTIAL HYMENECTOMY      Family History:  Family History  Problem Relation Age of Onset  . Congestive Heart Failure Mother   . Diabetes Mother   . Hypertension Mother   . Diabetes Father   . Hypertension Father     Social History:  Social History   Socioeconomic History  . Marital status: Married    Spouse name: Not on file  .  Number of children: Not on file  . Years of education: Not on file  . Highest education level: Not on file  Occupational History  . Not on file  Tobacco Use  . Smoking status: Never Smoker  . Smokeless tobacco: Never Used  Vaping Use  . Vaping Use: Never used  Substance and Sexual Activity  . Alcohol use: Never  . Drug use: Never  . Sexual activity: Not Currently  Other Topics Concern  . Not on file  Social History Narrative  . Not on file   Social Determinants of Health   Financial Resource Strain: Not on file  Food Insecurity: Not on file  Transportation Needs: Not on file  Physical Activity: Not on file  Stress: Not on file  Social Connections: Not on file  Intimate Partner Violence: Not on file    Allergies:  No Known Allergies  Medications: Prior to Admission medications   Medication Sig Start Date End Date Taking? Authorizing Provider  acetaminophen (TYLENOL) 325 MG tablet Take 2 tablets (650 mg total) by mouth every 4 (four) hours as needed (for pain scale < 4). 04/07/20  Yes Gevin Perea, CNM  Prenatal Vit-Fe Fumarate-FA (MULTIVITAMIN-PRENATAL) 27-0.8 MG TABS tablet Take 1 tablet by mouth daily at 12 noon.   Yes [provider]  ferrous sulfate 325 (65 FE) MG tablet Take 1 tablet (325 mg total) by mouth every other day. Patient not taking: Reported on 05/03/2020 04/07/20   Zipporah Plants, CNM  ibuprofen (ADVIL) 600 MG tablet Take 1 tablet (600 mg total) by mouth  every 6 (six) hours. Patient not taking: Reported on 05/03/2020 04/07/20   Zipporah Plants, CNM    Physical Exam Blood pressure 100/62, height 5\' 2"  (1.575 m), weight 160 lb (72.6 kg), unknown if currently breastfeeding.    General: NAD  HEENT: normocephalic, anicteric Pulmonary: No increased work of breathing Breast: Large, 8cmx4cm, indurated, erythematous mass noted on the upper, median portion of the right breast. Extremely tender to palpation. Extremities: no edema, erythema, or  tenderness Neurologic: Grossly intact Psychiatric: mood appropriate, affect full    Assessment: 30 y.o. G1P1001 presenting for breast pain with breast mass noted on exam following recent completion of antibiotic therapy for mastitis.  Plan: Problem List Items Addressed This Visit      Other   Mastitis - Primary   Breast mass     -Reviewed findings with Dr. 37, plan made to direct admission for IV antibx, breast Tiburcio Pea for evaluation for abscess  -Recommend patient present to [facility] for direct admission   [facility], CNM, MSN [facility], [facility] 05/03/2020, 4:28 PM

## 2020-05-03 NOTE — H&P (Signed)
Obstetrics & Gynecology History and Physical Note  Date of Consultation: 05/03/2020   Primary OBGYN: [facility] Reason for Consultation: Mastitis  History of Present Illness: [name] is a 30 y.o. G1P1001 (No LMP recorded.), with the above CC. Pt delivered 04/06/20, VAVD.  She subsequently developed pain in right breast, body aches, fever, and headaches around 1/12, and was started on po ABX on 1/15.  She started feeling better after a couple of days, and presented today after finishing ABX yesterday, with minimal sx's but noticeable redness tenderness and firmess to right breast.  Pt is breast feeding.  ROS: A review of systems was performed and was complete and comprehensive, except as stated in the above HPI.  OBGYN History: As per HPI. OB History    Gravida  1   Para  1   Term  1   Preterm      AB      Living  1     SAB      IAB      Ectopic      Multiple  0   Live Births  1            Past Medical History: Past Medical History:  Diagnosis Date  . 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 PARTIAL HYMENECTOMY      Family History:  Family History  Problem Relation Age of Onset  . Congestive Heart Failure Mother   . Diabetes Mother   . Hypertension Mother   . Diabetes Father   . Hypertension Father    She denies any female cancers, bleeding or blood clotting disorders.   Social History:  Social History   Socioeconomic History  . Marital status: Married    Spouse name: Not on file  . Number of children: Not on file  . Years of education: Not on file  . Highest education level: Not on file  Occupational History  . Not on file  Tobacco Use  . Smoking status: Never Smoker  . Smokeless tobacco: Never Used  Vaping Use  . Vaping Use: Never used  Substance and Sexual Activity  . Alcohol use: Never  . Drug use: Never  . Sexual activity: Not Currently  Other Topics Concern   . Not on file  Social History Narrative  . Not on file   Social Determinants of Health   Financial Resource Strain: Not on file  Food Insecurity: Not on file  Transportation Needs: Not on file  Physical Activity: Not on file  Stress: Not on file  Social Connections: Not on file  Intimate Partner Violence: Not on file    Allergy: No Known Allergies  Current Outpatient Medications: Medications Prior to Admission  Medication Sig Dispense Refill Last Dose  . Prenatal Vit-Fe Fumarate-FA (MULTIVITAMIN-PRENATAL) 27-0.8 MG TABS tablet Take 1 tablet by mouth daily at 12 noon.   05/02/2020 at Unknown time  . acetaminophen (TYLENOL) 325 MG tablet Take 2 tablets (650 mg total) by mouth every 4 (four) hours as needed (for pain scale < 4).   Unknown at Unknown time  . ferrous sulfate 325 (65 FE) MG tablet Take 1 tablet (325 mg total) by mouth every other day. (Patient not taking: No sig reported)  3 Unknown at Unknown time  . ibuprofen (ADVIL) 600 MG tablet Take 1 tablet (600 mg total) by mouth every 6 (six) hours. (Patient not taking: No sig reported) 30 tablet 0 Unknown  at Unknown time    Hospital Medications: Current Facility-Administered Medications  Medication Dose Route Frequency Provider Last Rate Last Admin  . acetaminophen (TYLENOL) tablet 650 mg  650 mg Oral Q4H PRN Nadara Mustard, MD      . alum & mag hydroxide-simeth (MAALOX/MYLANTA) 200-200-20 MG/5ML suspension 30 mL  30 mL Oral Q4H PRN Nadara Mustard, MD      . Ampicillin-Sulbactam (UNASYN) 3 g in sodium chloride 0.9 % 100 mL IVPB  3 g Intravenous Q6H Nadara Mustard, MD 200 mL/hr at 05/03/20 1817 3 g at 05/03/20 1817  . lactated ringers infusion   Intravenous Continuous Nadara Mustard, MD 100 mL/hr at 05/03/20 1808 New Bag at 05/03/20 1808  . [START ON 05/04/2020] multivitamin-prenatal tablet 1 tablet  1 tablet Oral Q1200 Nadara Mustard, MD      . oxyCODONE-acetaminophen (PERCOCET/ROXICET) 5-325 MG per tablet 1-2 tablet   1-2 tablet Oral Q3H PRN Nadara Mustard, MD      . simethicone [facility]) chewable tablet 80 mg  80 mg Oral QID PRN Nadara Mustard, MD        Physical Exam: Vitals:   05/03/20 1714 05/03/20 1958  BP: 98/75 95/63  Pulse: 99 91  Resp: 18 16  Temp: 98.1 F (36.7 C) 98.3 F (36.8 C)  TempSrc: Oral Oral  SpO2: 98% 97%  Weight: 71 kg   Height: 5\' 2"  (1.575 m)    Constitutional: Well nourished, well developed female in no acute distress.  HEENT: normal Neck:  Supple, normal appearance, and no thyromegaly  Cardiovascular:Regular rate and rhythm.  No murmurs, rubs or gallops. Respiratory:  Clear to auscultation bilateral. Normal respiratory effort Chest: RIGHT BREAST with erythema, firmness, heat, and tenderness in UIQ.  Prior measurements in office 8x4 cm. Abdomen: positive bowel sounds and no masses, hernias; diffusely non tender to palpation, non distended Neuro: grossly intact Psych:  Normal mood and affect.  Skin:  Warm and dry.  MS: normal gait and normal bilateral lower extremity strength/ROM/symmetry Lymphatic:  No inguinal lymphadenopathy.   Recent Labs  Lab 05/03/20 1755  WBC 13.9*  HGB 13.7  HCT 40.7  PLT 496*   Recent Labs  Lab 05/03/20 1755  NA 138  K 4.0  CL 100  CO2 25  BUN 12  CREATININE 0.72  CALCIUM 9.9  GLUCOSE 111*   Imaging:  Ultrasound scheduled.  Assessment: [name] is a 30 y.o. G1P1001 (No LMP recorded.) who presented to the ED with complaints of breast tenderness; findings are consistent with mastitis.  Plan: Since po ABX did not resolve, will admit for IV ABX 37 to assess for abscess WBC and fever (none) pattern stable Consider gen surg consult based on response and/or [place] results Discussed w pt possibility of several day stay to allow to resolve, also possibility of surgery She will cont to breast feed Reg diet, ambulation ad lib  [place], MD, [facility], [facility] 05/03/2020  11:21 PM Pager  [phone]

## 2020-05-04 ENCOUNTER — Encounter: Payer: Self-pay | Admitting: Obstetrics & Gynecology

## 2020-05-04 ENCOUNTER — Observation Stay: Payer: 59

## 2020-05-04 DIAGNOSIS — N61 Mastitis without abscess: Secondary | ICD-10-CM

## 2020-05-04 HISTORY — PX: BREAST CYST ASPIRATION: SHX578

## 2020-05-04 LAB — SARS CORONAVIRUS 2 (TAT 6-24 HRS): SARS Coronavirus 2: NEGATIVE

## 2020-05-04 MED ORDER — VANCOMYCIN HCL IN DEXTROSE 1-5 GM/200ML-% IV SOLN
1000.0000 mg | Freq: Two times a day (BID) | INTRAVENOUS | Status: DC
  Administered 2020-05-04 – 2020-05-08 (×8): 1000 mg via INTRAVENOUS
  Filled 2020-05-04 (×10): qty 200

## 2020-05-04 MED ORDER — DIPHENHYDRAMINE HCL 50 MG/ML IJ SOLN
25.0000 mg | Freq: Four times a day (QID) | INTRAMUSCULAR | Status: DC | PRN
  Administered 2020-05-04 – 2020-05-08 (×8): 25 mg via INTRAVENOUS
  Filled 2020-05-04 (×7): qty 1

## 2020-05-04 MED ORDER — VANCOMYCIN HCL 1500 MG/300ML IV SOLN
1500.0000 mg | Freq: Once | INTRAVENOUS | Status: AC
  Administered 2020-05-04: 1500 mg via INTRAVENOUS
  Filled 2020-05-04: qty 300

## 2020-05-04 NOTE — Progress Notes (Addendum)
Vancomycin loading dose started 1011 am. Pt called RN into room at 1117 am c/o generalized itching that just started. No rash or redness noted. Vancomycin stopped. Pharmacy notified and provider paged to see what they would like to do going forward.   Update 12pm: Dr. Jerene Pitch would like to give benadryl and restart vanc dose at this time. Pt updated and agreed.

## 2020-05-04 NOTE — Lactation Note (Signed)
Lactation Consultation Note  Patient Name: Patricia Ashley GHWEX'H Date: 05/04/2020   Age:31 y.o.   Lactation met with mom at bedside. Mom is admitted for mastitis that was not resolved with oral abx. She is scheduled for ultrasound later this morning to provide more information. Mom is exclusively BF her 42 week old on demand with use of nipple shield. Mom has been encouraged to continue BF on demand (infant is rooming-in), massaging and using breast compressions localized to the tender area while nursing, applying warmth while feeding to help.  Parents have no questions at this time.   Lavonia Drafts 05/04/2020, 10:30 AM

## 2020-05-04 NOTE — Progress Notes (Addendum)
Subjective:  Patient reports feeling better today than yesterday. She is in bed side-lying nursing on left breast. She reports less tenderness in the right side- affected breast. We discussed plan of care- ultrasound this morning to determine need for further treatment including surgery consult or aspiration. She denies any fever, chills, body aches.   Objective:  Vital signs in last 24 hours: Temp:  [98.1 F (36.7 C)-99 F (37.2 C)] 98.3 F (36.8 C) (01/26 0726) Pulse Rate:  [87-99] 87 (01/26 0726) Resp:  [16-18] 18 (01/26 0726) BP: (91-100)/(62-75) 91/69 (01/26 0726) SpO2:  [97 %-100 %] 100 % (01/26 0726) Weight:  [71 kg-72.6 kg] 71 kg (01/25 1714)    General: NAD Pulmonary: no increased work of breathing Abdomen: non-distended, non-tender, fundus firm at level of umbilicus Extremities: no edema, no erythema, no tenderness Breast: Left breast soft, non- tender, no lumps. Right breast upper inner with firm to palpation, slightly red and mildly tender to palpation, normal skin temperature. Area is roughly 7-8 cm x 3-4 cm.  Results for orders placed or performed during the hospital encounter of 05/03/20 (from the past 72 hour(s))  Basic metabolic panel     Status: Abnormal   Collection Time: 05/03/20  5:55 PM  Result Value Ref Range   Sodium 138 135 - 145 mmol/L   Potassium 4.0 3.5 - 5.1 mmol/L   Chloride 100 98 - 111 mmol/L   CO2 25 22 - 32 mmol/L   Glucose, Bld 111 (H) 70 - 99 mg/dL    Comment: Glucose reference range applies only to samples taken after fasting for at least 8 hours.   BUN 12 6 - 20 mg/dL   Creatinine, Ser 7.35 0.44 - 1.00 mg/dL   Calcium 9.9 8.9 - 32.9 mg/dL   GFR, Estimated >92 >42 mL/min    Comment: (NOTE) Calculated using the CKD-EPI Creatinine Equation (2021)    Anion gap 13 5 - 15    Comment: Performed at [facility], [address] [place], [place] [address]  CBC WITH DIFFERENTIAL     Status: Abnormal   Collection Time: 05/03/20  5:55  PM  Result Value Ref Range   WBC 13.9 (H) 4.0 - 10.5 K/uL   RBC 4.54 3.87 - 5.11 MIL/uL   Hemoglobin 13.7 12.0 - 15.0 g/dL   HCT 96.2 22.9 - 79.8 %   MCV 89.6 80.0 - 100.0 fL   MCH 30.2 26.0 - 34.0 pg   MCHC 33.7 30.0 - 36.0 g/dL   RDW 92.1 19.4 - 17.4 %   Platelets 496 (H) 150 - 400 K/uL   nRBC 0.1 0.0 - 0.2 %   Neutrophils Relative % 76 %   Neutro Abs 10.6 (H) 1.7 - 7.7 K/uL   Lymphocytes Relative 15 %   Lymphs Abs 2.0 0.7 - 4.0 K/uL   Monocytes Relative 5 %   Monocytes Absolute 0.6 0.1 - 1.0 K/uL   Eosinophils Relative 3 %   Eosinophils Absolute 0.5 0.0 - 0.5 K/uL   Basophils Relative 0 %   Basophils Absolute 0.0 0.0 - 0.1 K/uL   Immature Granulocytes 1 %   Abs Immature Granulocytes 0.13 (H) 0.00 - 0.07 K/uL    Comment: Performed at [facility], [address] [place], [place] [address]  SARS CORONAVIRUS 2 (TAT 6-24 HRS) Nasopharyngeal Nasopharyngeal Swab     Status: None   Collection Time: 05/03/20  8:46 PM   Specimen: Nasopharyngeal Swab  Result Value Ref Range  SARS Coronavirus 2 NEGATIVE NEGATIVE    Comment: (NOTE) SARS-CoV-2 target nucleic acids are NOT DETECTED.  The SARS-CoV-2 RNA is generally detectable in upper and lower respiratory specimens during the acute phase of infection. Negative results do not preclude SARS-CoV-2 infection, do not rule out co-infections with other pathogens, and should not be used as the sole basis for treatment or other patient management decisions. Negative results must be combined with clinical observations, patient history, and epidemiological information. The expected result is Negative.  Fact Sheet for Patients: HairSlick.no  Fact Sheet for Healthcare Providers: quierodirigir.com  This test is not yet approved or cleared by the [place] FDA and  has been authorized for detection and/or diagnosis of SARS-CoV-2 by FDA under an Emergency Use Authorization  (EUA). This EUA will remain  in effect (meaning this test can be used) for the duration of the COVID-19 declaration under Se ction 564(b)(1) of the Act, 21 U.S.C. section 360bbb-3(b)(1), unless the authorization is terminated or revoked sooner.  Performed at [facility], [address] [place], [place] [address]    Total time spent with patient: 15 minutes  Assessment:   30 y.o. G1P1001 3 weeks postpartum, mastitis  Plan:    1) Regular diet, ambulation ad lib  2) IV antibiotics- received Unasyn initially and new order for Vancomycin per Dr Jerene Pitch  3) Breast ultrasound this morning- further treatment pending ultrasound results  4) Continue breastfeeding  5) Disposition: continue current care, discharge pending progress   [name], CNM [facility] 05/04/2020, 9:40 AM

## 2020-05-04 NOTE — Consult Note (Signed)
Pharmacy Antibiotic Note  Patricia Ashley is a 30 y.o. female admitted on 05/03/2020 with cellulitis/Mastitis  Pharmacy has been consulted for Vancomyin dosing.  Patient with recent history of vaginal delivery 04/06/20. She subsequently developed pain in right breast, body aches, fever, and headaches around 1/12, and was started on po ABX on 1/15. Presented 1/25 after finishing oral ABX with noticeable redness tenderness and firmess to right breast. Patient is breastfeeding.   Plan:  Vancomycin 1500 mg IV x 1 Loading dose followed by  Vancomycin 1000 mg Q12H for  Goal AUC 400-550.  Expected AUC: 539  SCr used: 0.8  Will continue to follow patient for changes in renal function  Levels at steady state if warranted   Height: 5\' 2"  (157.5 cm) Weight: 71 kg (156 lb 8.4 oz) IBW/kg (Calculated) : 50.1  Temp (24hrs), Avg:98.5 F (36.9 C), Min:98.1 F (36.7 C), Max:99 F (37.2 C)  Recent Labs  Lab 05/03/20 1755  WBC 13.9*  CREATININE 0.72    Estimated Creatinine Clearance: 95.8 mL/min (by C-G formula based on SCr of 0.72 mg/dL).    No Known Allergies  Antimicrobials this admission: Unasyn >> 12/25-12/26   Dose adjustments this admission: n/a  Microbiology results: n/a  Thank you for allowing pharmacy to be a part of this patient's care.  06-15-1977, PharmD, BCPS Clinical Pharmacist  05/04/2020 10:18 AM

## 2020-05-04 NOTE — Progress Notes (Signed)
Patient had "Approximately 10 mL of purulence fluid was aspirated and sent to microbiology. Aspiration was also attempted for the additional smaller adjacent collection at 3 o'clock 12 cm from the nipple but no fluid was aspirated due to the thickness of the contents" today by Emmaline Kluver M.D. after having a breast ultrasound to assess her right breast.   Patient states right breast is sore but refuses pain medication. Tolerated rest of vancomycin dose with benadryl due to itching.

## 2020-05-05 DIAGNOSIS — N61 Mastitis without abscess: Secondary | ICD-10-CM

## 2020-05-05 DIAGNOSIS — N611 Abscess of the breast and nipple: Secondary | ICD-10-CM

## 2020-05-05 LAB — BASIC METABOLIC PANEL
Anion gap: 10 (ref 5–15)
BUN: 11 mg/dL (ref 6–20)
CO2: 24 mmol/L (ref 22–32)
Calcium: 9.3 mg/dL (ref 8.9–10.3)
Chloride: 104 mmol/L (ref 98–111)
Creatinine, Ser: 0.76 mg/dL (ref 0.44–1.00)
GFR, Estimated: >60 mL/min (ref >60)
Glucose, Bld: 130 mg/dL — ABNORMAL HIGH (ref 70–99)
Potassium: 4.4 mmol/L (ref 3.5–5.1)
Sodium: 138 mmol/L (ref 135–145)

## 2020-05-05 NOTE — Progress Notes (Signed)
Pt feeding infant on R breast, never pumped from earlier feed. Said her pain was better, rating it a 4 out of 10 and that the tylenol was working.

## 2020-05-05 NOTE — Progress Notes (Signed)
Mom currently feeding on L breast in side lying position with a nipple shield. Educated them on the importance of feeding on the R breast as well. FOB stated that she had been doing so. Denies overall pain but breast was tender to touch. The inner R breast is red, hard, and warm. Refused tylenol at this time.

## 2020-05-05 NOTE — Lactation Note (Signed)
Lactation Consultation Note  Patient Name: Patricia Ashley [id number]'Q Date: 05/05/2020 Reason for consult: Follow-up assessment;Other (Comment) (admitted for mastitis) Age:30 y.o.   Follow-up with lactation at bedside. Mom was able to have one milk duct cleared of 57mL fluid after ultrasound, unable to empty a second duct. Mom has continued to breastfeed on demand with 3wk old infant, using nipple shield. This morning set-up DEBP and assisted with hands on pump with breast massage and warmth localized to affected area. Noticeable change in breast post BF, with an output of 2oz in affected breast 10 minutes after BF. Dicussed with pt set-up of DEBP, use and cleaning/milk storage. Husband given instructions on contacting his insurance company for DEBP obtainment for use at home post discharge. Patient instructed to use pump with localized warmth and attention to area post breastfeeding.  All questions answered.  Maternal Data Formula Feeding for Exclusion: No Has patient been taught Hand Expression?: Yes Does the patient have breastfeeding experience prior to this delivery?: No  Feeding    LATCH Score                   Interventions Interventions: DEBP  Lactation Tools Discussed/Used Tools: Pump Breast pump type: Double-Electric Breast Pump Pump Education: Setup, frequency, and cleaning;Milk Storage Initiated by:: [name] Date initiated:: 05/05/20   Consult Status      Patricia Ashley 05/05/2020, 9:23 AM

## 2020-05-05 NOTE — Progress Notes (Signed)
Subjective: Patient reports feeling overall better than yesterday. She has received several visits from Lactation, and has been advised to apply heat prior to feeds, then follow the feeding with cold packs. Also advised to use the breast pump after feeds to optimize emptying of the breast. Her right breast feels slightly less enlarged and softer compared to yesterday, per her report. Still tender to touch and palpation.Her husband is present and supportive. The right breast is not continuing to drain from the aspiration sites   Objective: I have reviewed patient's vital signs, medications and previous provider notes.. BP 101/75 (BP Location: Right Arm)   Pulse 97   Temp 99.3 F (37.4 C) (Oral)   Resp 20   Ht 5\' 2"  (1.575 m)   Wt 71 kg   SpO2 99%   BMI 28.63 kg/m  Breasts are lactating bilaterally. There is a hardened red area (now marked with surgical pen) that extends from approximately 12:00  on the right breast around the breast all the way to the 7:00 position. From 12:00 to 3:00, the tissue is quite hard and solid. The left breast is unaffected. Nipples are well everted and without strips or any lacerations. Two band aids applied to the previous aspiration sites.  Her baby is evaluated briefly- strong , eager suck noted. No tongue tie. Lips flange outwardly and tongue is well extended.   Assessment/Plan: Mastitis of right breast, S/P aspiration of breast abcess. Continue with heat, then cold per Lactation. Breast marked with surgical pen. Will consider ultrasound per Dr. to evaluatefor improvement in abcess size.  Based on sono, may consult Surgery to evaluate for an I and D procedure. Dr. Jean Rosenthal also in to see patient, and he will follow today.  LOS: 2 days    Jean Rosenthal 05/05/2020, 1:15 PM

## 2020-05-05 NOTE — Progress Notes (Signed)
Pt feeding infant on L breast when RN entered the room in side lying position. Instructed pt to feed on the other breast. Spoke with care team (Lactation and Midwife) about pts current progress.

## 2020-05-05 NOTE — Progress Notes (Signed)
Nurse called into the room and asked for Lactation help (Lactation had left for the day). Pt currently feeding infant on L breast. Asked patient if she had fed on the R breast. FOB said no. Explained to them that she needed to feed or pump on the R breast with every feed. FOB asked how to use the pump. States Lactation had done it for them every time and they did not know how to do it. Demonstration done of how to set up and use the pump. FOB verbalized understanding, mom continued to feed on L breast.

## 2020-05-05 NOTE — Lactation Note (Signed)
Lactation Consultation Note  Patient Name: Patricia Ashley [id number]'O Date: 05/05/2020 Reason for consult: Follow-up assessment;Other (Comment) (admitted for mastitis) Age:30 y.o.  Lactation again present at bedside to assist with feeding on affected breast with breast warmth, massage, and compression. Patient reports feeding on opposite breast first for approximately 15 minutes before switching to this side, and she did not follow-up last feed with pumping affected breast. LC present for entire feed of additional 10 minutes on affected breast, and assisted with set-up and pumping of breast post feed. Warmth applied, massage, and compression. Patient reports period of discomfort with massage, and denies tylenol or motrin for pain/inflammation.  Patient instruction to feed on affected breast at next feed, offering to assist with different positioning besides side-lying; explained how this can emptying other ducts- Patient and support person verbalized understanding. Then patient was also instructed to pump affected breast with warmth and massage post feeding.   Maternal Data Formula Feeding for Exclusion: No Has patient been taught Hand Expression?: Yes Does the patient have breastfeeding experience prior to this delivery?: No  Feeding    LATCH Score                   Interventions Interventions: DEBP  Lactation Tools Discussed/Used Tools: Pump Breast pump type: Double-Electric Breast Pump Pump Education: Setup, frequency, and cleaning;Milk Storage Initiated by:: [name] Date initiated:: 05/05/20   Consult Status      Danford Bad 05/05/2020, 12:42 PM

## 2020-05-05 NOTE — Lactation Note (Signed)
Lactation Consultation Note  Patient Name: Patricia Ashley HQION'G Date: 05/05/2020 Reason for consult: Follow-up assessment;Other (Comment) (admitted for mastitis) Age:30 y.o.  Assisted mom at bedside with feeding. Baby in sidelying on affected breast (R). LC assisted with breast compression/massage throughout feeding, spontaneous audible swallows with notable softening of breast tissue. Baby released breast after 18 minutes, arms up, asleep beside mom. 30-1hr will follow-up with pumping R/L breasts if baby doesn't reawaken.   Maternal Data Formula Feeding for Exclusion: No Has patient been taught Hand Expression?: Yes Does the patient have breastfeeding experience prior to this delivery?: No  Feeding    LATCH Score                   Interventions Interventions: DEBP  Lactation Tools Discussed/Used Tools: Pump Breast pump type: Double-Electric Breast Pump Pump Education: Setup, frequency, and cleaning;Milk Storage Initiated by:: Patricia Ashley Date initiated:: 05/05/20   Consult Status      Danford Bad 05/05/2020, 10:45 AM

## 2020-05-05 NOTE — Progress Notes (Addendum)
Instructed pt again on the importance of stimulating R breast every couple of hours, whether it be pumping or feeding. FOB verbalized understanding. Mom nodded with understanding. Also asked if she was in pain, FOB stated yes that she is in pain. Tylenol offered, FOB agreed that she wanted it. Mom nodded. Asked mom again if she would like some tylenol since she has been refusing it all day. She said yes. Pt is currently eating dinner and FOB stated she would pump R breast after she finishes.

## 2020-05-06 ENCOUNTER — Inpatient Hospital Stay: Payer: 59

## 2020-05-06 DIAGNOSIS — B958 Unspecified staphylococcus as the cause of diseases classified elsewhere: Secondary | ICD-10-CM

## 2020-05-06 DIAGNOSIS — O9113 Abscess of breast associated with lactation: Secondary | ICD-10-CM

## 2020-05-06 MED ORDER — LACTATED RINGERS IV SOLN: INTRAVENOUS | Status: DC

## 2020-05-06 NOTE — Progress Notes (Signed)
Admit Date: 05/03/2020 Today's Date: 05/06/2020  Subjective:  Hd #3 Right Breast Mastitis, failed outpatient (oral Abx) therapy    IV ABX, US guided aspiration since admission Pt reports mild pain to this area, took Tylenol once for pain     Denies fever, chills    Attempting to breast feed on this side  Objective: Temp:  [98.4 F (36.9 C)-99.3 F (37.4 C)] 98.4 F (36.9 C) (01/27 2201) Pulse Rate:  [78-100] 78 (01/27 2201) Resp:  [18-20] 20 (01/27 2201) BP: (91-112)/(62-86) 98/67 (01/27 2201) SpO2:  [99 %-100 %] 100 % (01/27 2201)  Physical Exam:  General: alert, cooperative and no distress Lochia: appropriate Breast: Left normal    Right breast with marked area of erythema without expansion since last pm    Still has firm and indurated feel to palpation    Tender Uterine Fundus: firm Incision: none DVT Evaluation: No evidence of DVT seen on physical exam.  Recent Labs    05/03/20 1755  HGB 13.7  HCT 40.7    Assessment/Plan: Right breast mastitis, abscess Vancomycin Gen surgery consult today for assessment for further strategies for treatment and resolution Pt needs to continue to pump and/or feed from this breast   LOS: 3 days   Letitia Libra [facility] 05/06/2020, 7:37 AM

## 2020-05-06 NOTE — Consult Note (Addendum)
Pharmacy Antibiotic Note  Patricia Ashley is a 30 y.o. female admitted on 05/03/2020 with cellulitis/Mastitis  Pharmacy has been consulted for Vancomyin dosing.  Patient with recent history of vaginal delivery 04/06/20. She subsequently developed pain in right breast, body aches, fever, and headaches around 1/12, and was started on po ABX on 1/15. Presented 1/25 after finishing oral ABX with noticeable redness tenderness and firmess to right breast. She underwent as aspiration by IR ib 1/26 and per notes may need surgical debridement this weekend.  Patient is breastfeeding.   Today, 05/06/2020  Day #3 vancomcyin  1/26 Breast aspiration - MRSA and possible anaerobe  SCR stable  Vancomycin 1gm IV q12h dose due 2200  1/29 0030 Vanc peak =  1/29 0900 vanc trough =   AUC =    Plan:  Vancomycin 1000 mg Q12H for  Goal AUC 400-550.  Expected AUC: 539  SCr used: 0.8  Check peak and trough with tonight's dose to calculate AUC  Monitor SCr  Suggest adding anaerobic coverage per culture result (ex. Ampicillin/sulbactam) - paged attending  Height: 5\' 2"  (157.5 cm) Weight: 71 kg (156 lb 8.4 oz) IBW/kg (Calculated) : 50.1  Temp (24hrs), Avg:98.9 F (37.2 C), Min:98.4 F (36.9 C), Max:99.3 F (37.4 C)  Recent Labs  Lab 05/03/20 1755 05/05/20 0259  WBC 13.9*  --   CREATININE 0.72 0.76    Estimated Creatinine Clearance: 95.8 mL/min (by C-G formula based on SCr of 0.76 mg/dL).    No Known Allergies  Antimicrobials this admission: Unasyn 1/25>1/26 Fluconazole 1/25 Vanc 1/26 >>   Dose adjustments this admission: n/a  Microbiology results: 1/26 Breast: MRSA, possible anaerobe  Thank you for allowing pharmacy to be a part of this patient's care.  2/26, PharmD, BCPS.   Work Cell: [phone] 05/06/2020 1:47 PM

## 2020-05-06 NOTE — Progress Notes (Signed)
05/06/2020  Patient had repeat right breast ultrasound.  I have personally viewed the images.  The patient has two persistent abscesses at 3 o'clock, 9 cm and 12 cm from nipple.  Both have enlarged compared to prior ultrasound on 05/04/20.  Given this, will take patient to OR tomorrow for I&D.  Discussed surgery with her and her husband at bedside, including risks of bleeding, injury to surrounding structures, need for further procedures.  NPO after midnight, continue IV vancomycin for MRSA on prior cultures.   Howie Ill, MD [facility]

## 2020-05-06 NOTE — Lactation Note (Signed)
Husband of pt has questions about diet and insurance coverage of breast pumps, encouraged a well balanced regular diet for mom and advised the husband to call the insurance co. today to see what breast pumps that they could get through insurance, if he was given a variety of pumps to get, I could advise as to which are the most effective.  Given LC phone no. written on white board for further questions.

## 2020-05-06 NOTE — Consult Note (Signed)
[facility] SURGICAL CONSULTATION NOTE (initial) - cpt: 69629   HISTORY OF PRESENT ILLNESS (HPI):  30 y.o. G60P1001 female s/p vaginal delivery on 04/06/2021 who was seen in her OB/GYN office ([facility]) on 01/25 secondary to complaints of mastitis. She first noticed body aches, fever, and right breast pain around 01/12 and was started on ABx (doxy) on 01/15. After completing ABx, her pain had improved but she had noticed significant erythema and induration of the right breast. Given that she had failed to resolved outpatient with PO Abx she was admitted to the hospital for IV ABx, initially with Unasyn but switched to vancomycin on 01/26. She would undergo right breast US with eventual aspiration on 01/26. Cx from this are growing staph aureus with susceptibilities pending. She is breast feeding. This morning, she reports that her right breast remains erythematous, indurated, and tender. No further fevers.   Surgery is consulted by OB/GYN physician Dr. Velora Mediate, MD in this context for evaluation and management of right breast mastitis with abscess.   PAST MEDICAL HISTORY (PMH):  Past Medical History:  Diagnosis Date  . 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):  Past Surgical History:  Procedure Laterality Date  . BREAST CYST ASPIRATION Right 05/04/2020  . PARTIAL HYMENECTOMY       MEDICATIONS:  Prior to Admission medications   Medication Sig Start Date End Date Taking? Authorizing Provider  acetaminophen (TYLENOL) 325 MG tablet Take 2 tablets (650 mg total) by mouth every 4 (four) hours as needed (for pain scale < 4). 04/07/20  Yes Veal, Katelyn, CNM  Prenatal Vit-Fe Fumarate-FA (MULTIVITAMIN-PRENATAL) 27-0.8 MG TABS tablet Take 1 tablet by mouth daily at 12 noon.   Yes [provider]  ferrous sulfate 325 (65 FE) MG tablet Take 1 tablet (325 mg total) by mouth every other day. Patient not taking: No sig reported 04/07/20   Zipporah Plants,  CNM  ibuprofen (ADVIL) 600 MG tablet Take 1 tablet (600 mg total) by mouth every 6 (six) hours. Patient not taking: No sig reported 04/07/20   Zipporah Plants, CNM     ALLERGIES:  No Known Allergies   SOCIAL HISTORY:  Social History   Socioeconomic History  . Marital status: Married    Spouse name: Not on file  . Number of children: Not on file  . Years of education: Not on file  . Highest education level: Not on file  Occupational History  . Not on file  Tobacco Use  . Smoking status: Never Smoker  . Smokeless tobacco: Never Used  Vaping Use  . Vaping Use: Never used  Substance and Sexual Activity  . Alcohol use: Never  . Drug use: Never  . Sexual activity: Not Currently  Other Topics Concern  . Not on file  Social History Narrative  . Not on file   Social Determinants of Health   Financial Resource Strain: Not on file  Food Insecurity: Not on file  Transportation Needs: Not on file  Physical Activity: Not on file  Stress: Not on file  Social Connections: Not on file  Intimate Partner Violence: Not on file     FAMILY HISTORY:  Family History  Problem Relation Age of Onset  . Congestive Heart Failure Mother   . Diabetes Mother   . Hypertension Mother   . Diabetes Father   . Hypertension Father       REVIEW OF SYSTEMS:  Review of Systems  Constitutional: Negative for chills and fever.  Respiratory: Negative for cough and wheezing.   Cardiovascular: Negative for chest pain and palpitations.  Gastrointestinal: Negative for abdominal pain, nausea and vomiting.  Genitourinary: Negative for dysuria and urgency.  Skin:       + Right Breast Erythema/Tenderness  All other systems reviewed and are negative.   VITAL SIGNS:  Temp:  [98.4 F (36.9 C)-99.3 F (37.4 C)] 99.3 F (37.4 C) (01/28 0819) Pulse Rate:  [78-100] 89 (01/28 0819) Resp:  [18-20] 20 (01/28 0819) BP: (94-112)/(67-86) 94/74 (01/28 0819) SpO2:  [99 %-100 %] 99 % (01/28 0819)     Height: 5'  2" (157.5 cm) Weight: 71 kg BMI (Calculated): 28.62   INTAKE/OUTPUT:  01/27 0701 - 01/28 0700 In: 1518.1 [I.V.:1318.1; IV Piggyback:200] Out: -   PHYSICAL EXAM:  Physical Exam Vitals and nursing note reviewed. Exam conducted with a chaperone present.  Constitutional:      General: She is not in acute distress.    Appearance: Normal appearance. She is not ill-appearing.     Comments: Patient laying in bed with newborn, husband at bedside  HENT:     Head: Normocephalic and atraumatic.  Eyes:     General: No scleral icterus.    Conjunctiva/sclera: Conjunctivae normal.  Pulmonary:     Effort: Pulmonary effort is normal. No respiratory distress.  Chest:  Breasts:     Right: No inverted nipple.     Left: No inverted nipple.        Comments: There is significant erythema to the right breast from the 3 - 5 o'clock positions, this area is indurated and warm to the touch, no gross areas of fluctuance palpable, two aspiration sites are covered with band-aids. No nipple changes.   No changes to the left breast noted Genitourinary:    Comments: Deferred Skin:    General: Skin is warm and dry.     Findings: Erythema present.  Neurological:     General: No focal deficit present.     Mental Status: She is alert and oriented to person, place, and time.  Psychiatric:        Mood and Affect: Mood normal.        Behavior: Behavior normal.      Labs:  CBC Latest Ref Rng & Units 05/03/2020 04/07/2020 04/06/2020  WBC 4.0 - 10.5 K/uL 13.9(H) 14.0(H) 17.3(H)  Hemoglobin 12.0 - 15.0 g/dL 20.2 10.7(L) 12.1  Hematocrit 36.0 - 46.0 % 40.7 31.4(L) 34.7(L)  Platelets 150 - 400 K/uL 496(H) 184 182   CMP Latest Ref Rng & Units 05/05/2020 05/03/2020  Glucose 70 - 99 mg/dL 542(H) 062(B)  BUN 6 - 20 mg/dL 11 12  Creatinine 7.62 - 1.00 mg/dL 8.31 5.17  Sodium 616 - 145 mmol/L 138 138  Potassium 3.5 - 5.1 mmol/L 4.4 4.0  Chloride 98 - 111 mmol/L 104 100  CO2 22 - 32 mmol/L 24 25  Calcium 8.9 -  10.3 mg/dL 9.3 9.9    Imaging studies:   Limited Right Breast US with subsequent aspiration (05/04/2020) personally reviewed showing two, ill defined, fluid collections in the right breast, and radiologist report reviewed: FINDINGS Targeted ultrasound is performed in the right breast at 3 o'clock 9 cm from the nipple demonstrating an irregular mixed density collection measuring at least 3.9 x 2.3 x 2.9 cm, consistent with an abscess. There is overlying skin thickening and adjacent echogenic tissue compatible with inflammatory changes. There is a second smaller nearby collection at 3 o'clock 12 cm from the nipple measuring  2.6 x 1.3 x 2.6 cm.   Assessment/Plan: (ICD-10's: N61.1) 30 y.o. female with right breast erythema, induration, and tenderness concerning for mastitis and abscess s/p aspiration on 01/26   - Given lack of improvement on physical examination despite aspiration 72 hours, we will plan on repeating US of the right breast to reassess these previously seen fluid collections. If these collections are persistent or worsened, then we will likely proceed to the OR tomorrow (01/29) for formal drainage.    - Continue IV Abx (Vancomycin); Cx with staph aureus; follow up sensitivity and narrow   - Pain control prn   - Further management per primary service; we will follow   All of the above findings and recommendations were discussed with the patient and her husband at bedside, and all of their questions were answered to their expressed satisfaction.  Thank you for the opportunity to participate in this patient's care.   -- Lynden Oxford, PA-C [facility] 05/06/2020, 9:18 AM [phone] M-F: 7am - 4pm

## 2020-05-06 NOTE — Consult Note (Signed)
Pharmacy Antibiotic Note  Patricia Ashley is a 30 y.o. female admitted on 05/03/2020 with cellulitis/Mastitis  Pharmacy has been consulted for Vancomyin dosing.  Patient with recent history of vaginal delivery 04/06/20. She subsequently developed pain in right breast, body aches, fever, and headaches around 1/12, and was started on po ABX on 1/15. Presented 1/25 after finishing oral ABX with noticeable redness tenderness and firmess to right breast. Patient is breastfeeding.   Plan:  Vancomycin 1500 mg IV x 1 Loading dose followed by  Vancomycin 1000 mg Q12H for  Goal AUC 400-550.  Expected AUC: 539  SCr used: 0.8  Will continue to follow patient for changes in renal function  Levels at steady state if warranted   Height: 5\' 2"  (157.5 cm) Weight: 71 kg (156 lb 8.4 oz) IBW/kg (Calculated) : 50.1  Temp (24hrs), Avg:99.1 F (37.3 C), Min:98.4 F (36.9 C), Max:99.3 F (37.4 C)  Recent Labs  Lab 05/03/20 1755 05/05/20 0259  WBC 13.9*  --   CREATININE 0.72 0.76    Estimated Creatinine Clearance: 95.8 mL/min (by C-G formula based on SCr of 0.76 mg/dL).    No Known Allergies  Antimicrobials this admission:  Unasyn 1/25>1/26 Fluconazole 1/25 Vanc 1/26 >>  Dose adjustments this admission: n/a  Microbiology results: 1/26  Breast cx: Staph aureus- sens pending  Thank you for allowing pharmacy to be a part of this patient's care.  2/26, PharmD Clinical Pharmacist  05/06/2020 8:30 AM

## 2020-05-07 ENCOUNTER — Encounter
Admission: AD | Disposition: A | Discharge status: Home / Self Care | Payer: Self-pay | Source: Home / Self Care | Attending: Obstetrics & Gynecology

## 2020-05-07 ENCOUNTER — Encounter: Payer: Self-pay | Admitting: Obstetrics & Gynecology

## 2020-05-07 ENCOUNTER — Inpatient Hospital Stay: Payer: 59 | Admitting: Anesthesiology

## 2020-05-07 DIAGNOSIS — N611 Abscess of the breast and nipple: Secondary | ICD-10-CM

## 2020-05-07 DIAGNOSIS — B9562 Methicillin resistant Staphylococcus aureus infection as the cause of diseases classified elsewhere: Secondary | ICD-10-CM

## 2020-05-07 HISTORY — PX: INCISION AND DRAINAGE ABSCESS: SHX5864

## 2020-05-07 LAB — CBC
HCT: 37.1 % (ref 36.0–46.0)
Hemoglobin: 12.4 g/dL (ref 12.0–15.0)
MCH: 29.9 pg (ref 26.0–34.0)
MCHC: 33.4 g/dL (ref 30.0–36.0)
MCV: 89.4 fL (ref 80.0–100.0)
Platelets: 415 10*3/uL — ABNORMAL HIGH (ref 150–400)
RBC: 4.15 MIL/uL (ref 3.87–5.11)
RDW: 12.6 % (ref 11.5–15.5)
WBC: 15 10*3/uL — ABNORMAL HIGH (ref 4.0–10.5)
nRBC: 0 % (ref 0.0–0.2)

## 2020-05-07 LAB — CREATININE, SERUM
Creatinine, Ser: 0.62 mg/dL (ref 0.44–1.00)
GFR, Estimated: >60 mL/min (ref >60)

## 2020-05-07 LAB — VANCOMYCIN, TROUGH: Vancomycin Tr: 10 ug/mL — ABNORMAL LOW (ref 15–20)

## 2020-05-07 LAB — BASIC METABOLIC PANEL
Anion gap: 10 (ref 5–15)
BUN: 12 mg/dL (ref 6–20)
CO2: 26 mmol/L (ref 22–32)
Calcium: 9.7 mg/dL (ref 8.9–10.3)
Chloride: 103 mmol/L (ref 98–111)
Creatinine, Ser: 0.58 mg/dL (ref 0.44–1.00)
GFR, Estimated: >60 mL/min (ref >60)
Glucose, Bld: 113 mg/dL — ABNORMAL HIGH (ref 70–99)
Potassium: 4.2 mmol/L (ref 3.5–5.1)
Sodium: 139 mmol/L (ref 135–145)

## 2020-05-07 LAB — VANCOMYCIN, PEAK: Vancomycin Pk: 39 ug/mL (ref 30–40)

## 2020-05-07 SURGERY — INCISION AND DRAINAGE, ABSCESS
Anesthesia: General | Site: Breast | Laterality: Right

## 2020-05-07 MED ORDER — PROPOFOL 10 MG/ML IV BOLUS
INTRAVENOUS | Status: DC | PRN
  Administered 2020-05-07: 150 mg via INTRAVENOUS
  Administered 2020-05-07: 30 mg via INTRAVENOUS

## 2020-05-07 MED ORDER — MIDAZOLAM HCL 2 MG/2ML IJ SOLN
INTRAMUSCULAR | Status: DC | PRN
  Administered 2020-05-07: 2 mg via INTRAVENOUS

## 2020-05-07 MED ORDER — MIDAZOLAM HCL 2 MG/2ML IJ SOLN
INTRAMUSCULAR | Status: AC
  Filled 2020-05-07: qty 2

## 2020-05-07 MED ORDER — DEXMEDETOMIDINE (PRECEDEX) IN NS 20 MCG/5ML (4 MCG/ML) IV SYRINGE
PREFILLED_SYRINGE | INTRAVENOUS | Status: AC
  Filled 2020-05-07: qty 5

## 2020-05-07 MED ORDER — DEXAMETHASONE SODIUM PHOSPHATE 10 MG/ML IJ SOLN
INTRAMUSCULAR | Status: AC
  Filled 2020-05-07: qty 1

## 2020-05-07 MED ORDER — LIDOCAINE HCL (CARDIAC) PF 100 MG/5ML IV SOSY
PREFILLED_SYRINGE | INTRAVENOUS | Status: DC | PRN
  Administered 2020-05-07: 80 mg via INTRAVENOUS

## 2020-05-07 MED ORDER — PHENYLEPHRINE HCL (PRESSORS) 10 MG/ML IV SOLN
INTRAVENOUS | Status: DC | PRN
  Administered 2020-05-07: 100 ug via INTRAVENOUS

## 2020-05-07 MED ORDER — DEXAMETHASONE SODIUM PHOSPHATE 10 MG/ML IJ SOLN
INTRAMUSCULAR | Status: DC | PRN
  Administered 2020-05-07: 10 mg via INTRAVENOUS

## 2020-05-07 MED ORDER — LACTATED RINGERS IV SOLN: INTRAVENOUS | Status: DC | PRN

## 2020-05-07 MED ORDER — FENTANYL CITRATE (PF) 100 MCG/2ML IJ SOLN
INTRAMUSCULAR | Status: DC | PRN
  Administered 2020-05-07 (×4): 25 ug via INTRAVENOUS

## 2020-05-07 MED ORDER — KETOROLAC TROMETHAMINE 30 MG/ML IJ SOLN
INTRAMUSCULAR | Status: AC
  Filled 2020-05-07: qty 1

## 2020-05-07 MED ORDER — ONDANSETRON HCL 4 MG/2ML IJ SOLN
INTRAMUSCULAR | Status: AC
  Filled 2020-05-07: qty 2

## 2020-05-07 MED ORDER — FENTANYL CITRATE (PF) 100 MCG/2ML IJ SOLN
25.0000 ug | INTRAMUSCULAR | Status: DC | PRN
  Administered 2020-05-07: 25 ug via INTRAVENOUS

## 2020-05-07 MED ORDER — OXYCODONE HCL 5 MG PO TABS
5.0000 mg | ORAL_TABLET | Freq: Once | ORAL | Status: AC | PRN
  Administered 2020-05-07: 5 mg via ORAL

## 2020-05-07 MED ORDER — OXYCODONE HCL 5 MG PO TABS
ORAL_TABLET | ORAL | Status: AC
  Filled 2020-05-07: qty 1

## 2020-05-07 MED ORDER — PROMETHAZINE HCL 25 MG/ML IJ SOLN: 6.2500 mg | INTRAMUSCULAR | Status: DC | PRN

## 2020-05-07 MED ORDER — DEXMEDETOMIDINE (PRECEDEX) IN NS 20 MCG/5ML (4 MCG/ML) IV SYRINGE
PREFILLED_SYRINGE | INTRAVENOUS | Status: DC | PRN
  Administered 2020-05-07 (×2): 8 ug via INTRAVENOUS
  Administered 2020-05-07: 4 ug via INTRAVENOUS

## 2020-05-07 MED ORDER — BUPIVACAINE LIPOSOME 1.3 % IJ SUSP
INTRAMUSCULAR | Status: DC | PRN
  Administered 2020-05-07: 50 mL

## 2020-05-07 MED ORDER — FENTANYL CITRATE (PF) 100 MCG/2ML IJ SOLN
INTRAMUSCULAR | Status: AC
  Filled 2020-05-07: qty 2

## 2020-05-07 MED ORDER — KETOROLAC TROMETHAMINE 30 MG/ML IJ SOLN: 30.0000 mg | Freq: Four times a day (QID) | INTRAMUSCULAR | Status: DC | PRN

## 2020-05-07 MED ORDER — ONDANSETRON HCL 4 MG/2ML IJ SOLN
INTRAMUSCULAR | Status: DC | PRN
  Administered 2020-05-07: 4 mg via INTRAVENOUS

## 2020-05-07 MED ORDER — OXYCODONE HCL 5 MG/5ML PO SOLN: 5.0000 mg | Freq: Once | ORAL | Status: AC | PRN

## 2020-05-07 MED ORDER — KETOROLAC TROMETHAMINE 30 MG/ML IJ SOLN
INTRAMUSCULAR | Status: DC | PRN
  Administered 2020-05-07: 30 mg via INTRAVENOUS

## 2020-05-07 SURGICAL SUPPLY — 35 items
BLADE SURG 15 STRL LF DISP TIS (BLADE) ×2 IMPLANT
BLADE SURG 15 STRL SS (BLADE) ×2
BNDG GAUZE 4.5X4.1 6PLY STRL (MISCELLANEOUS) ×2 IMPLANT
CANISTER SUCT 1200ML W/VALVE (MISCELLANEOUS) ×2 IMPLANT
CHLORAPREP W/TINT 26 (MISCELLANEOUS) ×2 IMPLANT
CNTNR SPEC 2.5X3XGRAD LEK (MISCELLANEOUS) ×1
CONT SPEC 4OZ STER OR WHT (MISCELLANEOUS) ×1
CONTAINER SPEC 2.5X3XGRAD LEK (MISCELLANEOUS) ×1 IMPLANT
COVER WAND RF STERILE (DRAPES) ×2 IMPLANT
DRAIN PENROSE 1/4X12 LTX STRL (WOUND CARE) ×4 IMPLANT
DRAPE LAPAROTOMY 77X122 PED (DRAPES) ×2 IMPLANT
DRSG GAUZE FLUFF 36X18 (GAUZE/BANDAGES/DRESSINGS) ×2 IMPLANT
ELECT REM PT RETURN 9FT ADLT (ELECTROSURGICAL) ×2
ELECTRODE REM PT RTRN 9FT ADLT (ELECTROSURGICAL) ×1 IMPLANT
GAUZE SPONGE 4X4 12PLY STRL (GAUZE/BANDAGES/DRESSINGS) ×2 IMPLANT
GLOVE SURG SYN 7.0 (GLOVE) ×2 IMPLANT
GLOVE SURG SYN 7.5  E (GLOVE) ×1
GLOVE SURG SYN 7.5 E (GLOVE) ×1 IMPLANT
GOWN STRL REUS W/ TWL LRG LVL3 (GOWN DISPOSABLE) ×2 IMPLANT
GOWN STRL REUS W/TWL LRG LVL3 (GOWN DISPOSABLE) ×2
KIT TURNOVER KIT A (KITS) ×2 IMPLANT
LABEL OR SOLS (LABEL) ×2 IMPLANT
MANIFOLD NEPTUNE II (INSTRUMENTS) ×2 IMPLANT
NEEDLE HYPO 22GX1.5 SAFETY (NEEDLE) ×2 IMPLANT
NS IRRIG 500ML POUR BTL (IV SOLUTION) ×2 IMPLANT
PACK BASIN MINOR ARMC (MISCELLANEOUS) ×2 IMPLANT
PAD ABD DERMACEA PRESS 5X9 (GAUZE/BANDAGES/DRESSINGS) ×2 IMPLANT
SOL PREP PVP 2OZ (MISCELLANEOUS) ×2
SOLUTION PREP PVP 2OZ (MISCELLANEOUS) ×1 IMPLANT
SPONGE LAP 18X18 RF (DISPOSABLE) ×4 IMPLANT
SUT SILK 0 (SUTURE) ×1
SUT SILK 0 30XBRD TIE 6 (SUTURE) ×1 IMPLANT
SWAB CULTURE AMIES ANAERIB BLU (MISCELLANEOUS) ×4 IMPLANT
SYR 20ML LL LF (SYRINGE) ×2 IMPLANT
SYR BULB IRRIG 60ML STRL (SYRINGE) ×2 IMPLANT

## 2020-05-07 NOTE — Anesthesia Postprocedure Evaluation (Signed)
Anesthesia Post Note  Patient: Patricia Ashley  Procedure(s) Performed: INCISION AND DRAINAGE ABSCESS (Right Breast)  Patient location during evaluation: PACU Anesthesia Type: General Level of consciousness: awake and alert Pain management: pain level controlled Vital Signs Assessment: post-procedure vital signs reviewed and stable Respiratory status: spontaneous breathing, nonlabored ventilation, respiratory function stable and patient connected to nasal cannula oxygen Cardiovascular status: blood pressure returned to baseline and stable Postop Assessment: no apparent nausea or vomiting Anesthetic complications: no   No complications documented.   Last Vitals:  Vitals:   05/07/20 1355 05/07/20 1400  BP: (!) 85/61 (!) 85/69  Pulse: 74 79  Resp: 19 (!) 21  Temp:    SpO2: 99% 100%    Last Pain:  Vitals:   05/07/20 1411  TempSrc:   PainSc: 5                  Cleda Mccreedy Sieanna Vanstone

## 2020-05-07 NOTE — Op Note (Signed)
  Procedure Date:  05/07/2020  Pre-operative Diagnosis:  Right breast abscess  Post-operative Diagnosis:  Right breast abscess  Procedure:  Incision and Drainage of right breast abscess  Surgeon:  Howie Ill, MD  Anesthesia:  General endotracheal  Estimated Blood Loss:  5 ml  Specimens:  None  Complications:  None  Indications for Procedure:  This is a 30 y.o. female with diagnosis of right breast abscess, requiring drainage procedure.  It had previously undergone aspiration, but recurred.  The risks of bleeding, abscess or infection, injury to surrounding structures, and need for further procedures were all discussed with the patient and was willing to proceed.  Description of Procedure: The patient was correctly identified in the preoperative area and brought into the operating room.  The patient was placed supine with VTE prophylaxis in place.  Appropriate time-outs were performed.  Anesthesia was induced and the patient was intubated.  Appropriate antibiotics were infused.  The patient's right breast was prepped and draped in usual sterile fashion.  A 2 cm incision was made over the most inferior portion of the abscess, revealing purulent fluid.  Small Kelly forceps and Yankauer were used to dissect around the abscess cavity to open any remaining pockets of purulent fluid.  This revealed a cavity that was about 10 cm x 8 cm.  The two abscesses that the ultrasound had seen communicated with each other.  Two additional counter incisions were made over the most medial portion and superior portion, to allow for good drainage.  After drainage was completed, the cavity was irrigated and cleaned.  Two Penrose drains were looped between incisions and tied using 0-Silk ties.  50 ml of Exparel solution combined with 0.5% bupivacaine with epi was infiltrated around the incisions.  The right breast was cleaned and dressed with 4x4 gauze, ABD pad, and tape.  The patient tolerated the procedure  well and all counts were correct at the end of the case.   Howie Ill, MD

## 2020-05-07 NOTE — Plan of Care (Signed)
  Problem: Health Behavior/Discharge Planning: Goal: Ability to manage health-related needs will improve 05/07/2020 2246 by [employer], RN Outcome: Progressing 05/07/2020 2245 by [employer], RN Outcome: Progressing 05/07/2020 2244 by [employer], RN Outcome: Progressing   Problem: Clinical Measurements: Goal: Will remain free from infection 05/07/2020 2246 by Just[employer], RN Outcome: Progressing 05/07/2020 2245 by [employer], RN Outcome: Progressing 05/07/2020 2244 by Just[employer], RN Outcome: Progressing Goal: Diagnostic test results will improve 05/07/2020 2246 by [employer], RN Outcome: Progressing 05/07/2020 2245 by [employer], RN Outcome: Progressing 05/07/2020 2244 by Just[employer], RN Outcome: Progressing Goal: Respiratory complications will improve 05/07/2020 2246 by [employer], RN Outcome: Progressing 05/07/2020 2245 by [employer], RN Outcome: Progressing 05/07/2020 2244 by Just[employer], RN Outcome: Progressing Goal: Cardiovascular complication will be avoided 05/07/2020 2246 by [employer], RN Outcome: Progressing 05/07/2020 2245 by [employer], RN Outcome: Progressing 05/07/2020 2244 by [employer], RN Outcome: Progressing   Problem: Activity: Goal: Risk for activity intolerance will decrease 05/07/2020 2246 by Just[employer], RN Outcome: Progressing 05/07/2020 2245 by Just[employer], RN Outcome: Progressing 05/07/2020 2244 by [employer], RN Outcome: Progressing   Problem: Nutrition: Goal: Adequate nutrition will be maintained 05/07/2020 2246 by Just[employer], RN Outcome: Progressing 05/07/2020 2245 by Just[employer], RN Outcome: Progressing 05/07/2020 2244 by Just[employer], RN Outcome: Progressing   Problem: Coping: Goal: Level of anxiety will decrease 05/07/2020 2246 by  Justus Memory, RN Outcome: Progressing 05/07/2020 2245 by Just[employer] Memory, RN Outcome: Progressing 05/07/2020 2244 by Justus Memory, RN Outcome: Progressing   Problem: Elimination: Goal: Will not experience complications related to bowel motility 05/07/2020 2246 by Just[employer] Memory, RN Outcome: Progressing 05/07/2020 2245 by Just[employer], RN Outcome: Progressing 05/07/2020 2244 by Just[employer], RN Outcome: Progressing Goal: Will not experience complications related to urinary retention 05/07/2020 2246 by Just[employer], RN Outcome: Progressing 05/07/2020 2245 by Just[employer], RN Outcome: Progressing 05/07/2020 2244 by Justus [employer], RN Outcome: Progressing   Problem: Pain Managment: Goal: General experience of comfort will improve 05/07/2020 2246 by Justus Memory, RN Outcome: Progressing 05/07/2020 2245 by Justus Memory, RN Outcome: Progressing 05/07/2020 2244 by Justus Memory, RN Outcome: Progressing   Problem: Safety: Goal: Ability to remain free from injury will improve 05/07/2020 2246 by Just[employer], RN Outcome: Progressing 05/07/2020 2245 by Justus Memory, RN Outcome: Progressing 05/07/2020 2244 by Just[employer] Memory, RN Outcome: Progressing   Problem: Skin Integrity: Goal: Risk for impaired skin integrity will decrease 05/07/2020 2246 by Just[employer], RN Outcome: Progressing 05/07/2020 2245 by Justus Memory, RN Outcome: Progressing 05/07/2020 2244 by Just[employer], RN Outcome: Progressing   Problem: Clinical Measurements: Goal: Ability to avoid or minimize complications of infection will improve 05/07/2020 2246 by Justus Memory, RN Outcome: Progressing 05/07/2020 2245 by Just[employer], RN Outcome: Progressing 05/07/2020 2244 by Justus Memory, RN Outcome: Progressing   Problem: Skin Integrity: Goal: Skin  integrity will improve 05/07/2020 2246 by Justus [employer], RN Outcome: Progressing 05/07/2020 2245 by Just[employer], RN Outcome: Progressing 05/07/2020 2244 by Justus Memory, RN Outcome: Progressing

## 2020-05-07 NOTE — Progress Notes (Deleted)
Social visit done earlier this morning prior to surgery. Patient reported some pain in the right breast. She reports continued breastfeeding from both breasts. She has been NPO since midnight. Her questions have been answered.  [name], CNM

## 2020-05-07 NOTE — Anesthesia Procedure Notes (Signed)
Procedure Name: LMA Insertion Date/Time: 05/07/2020 11:13 AM Performed by: Karoline Caldwell, CRNA Pre-anesthesia Checklist: Patient identified, Patient being monitored, Timeout performed, Emergency Drugs available and Suction available Patient Re-evaluated:Patient Re-evaluated prior to induction Oxygen Delivery Method: Circle system utilized Preoxygenation: Pre-oxygenation with 100% oxygen Induction Type: IV induction Ventilation: Mask ventilation without difficulty LMA: LMA inserted LMA Size: 3.5 Tube type: Oral Number of attempts: 1 Placement Confirmation: positive ETCO2 and breath sounds checked- equal and bilateral Tube secured with: Tape Dental Injury: Teeth and Oropharynx as per pre-operative assessment

## 2020-05-07 NOTE — Progress Notes (Signed)
Pt to OR via bed and OR RN.

## 2020-05-07 NOTE — Plan of Care (Signed)
  Problem: Skin Integrity: Goal: Skin integrity will improve Outcome: Progressing   Problem: Clinical Measurements: Goal: Ability to avoid or minimize complications of infection will improve Outcome: Progressing   Problem: Skin Integrity: Goal: Risk for impaired skin integrity will decrease Outcome: Progressing   Problem: Safety: Goal: Ability to remain free from injury will improve Outcome: Progressing

## 2020-05-07 NOTE — Anesthesia Preprocedure Evaluation (Signed)
Anesthesia Evaluation  Patient identified by MRN, date of birth, ID band Patient awake    Reviewed: Allergy & Precautions, H&P , NPO status , Patient's Chart, lab work & pertinent test results  History of Anesthesia Complications Negative for: history of anesthetic complications  Airway Mallampati: III  TM Distance: >3 FB Neck ROM: full    Dental  (+) Chipped   Pulmonary neg shortness of breath,    Pulmonary exam normal        Cardiovascular Exercise Tolerance: Good (-) angina(-) Past MI and (-) DOE negative cardio ROS Normal cardiovascular exam     Neuro/Psych negative neurological ROS  negative psych ROS   GI/Hepatic negative GI ROS, Neg liver ROS,   Endo/Other  negative endocrine ROS  Renal/GU      Musculoskeletal   Abdominal   Peds  Hematology negative hematology ROS (+)   Anesthesia Other Findings 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  Past Surgical History: 05/04/2020: BREAST CYST ASPIRATION; Right No date: PARTIAL HYMENECTOMY  BMI    Body Mass Index: 28.63 kg/m      Reproductive/Obstetrics negative OB ROS                             Anesthesia Physical Anesthesia Plan  ASA: II  Anesthesia Plan: General LMA   Post-op Pain Management:    Induction: Intravenous  PONV Risk Score and Plan: Dexamethasone, Ondansetron, Midazolam and Treatment may vary due to age or medical condition  Airway Management Planned: LMA  Additional Equipment:   Intra-op Plan:   Post-operative Plan: Extubation in OR  Informed Consent: I have reviewed the patients History and Physical, chart, labs and discussed the procedure including the risks, benefits and alternatives for the proposed anesthesia with the patient or authorized representative who has indicated his/her understanding and acceptance.     Dental Advisory Given  Plan Discussed with: Anesthesiologist, CRNA  and Surgeon  Anesthesia Plan Comments: (Anesthesia team plans to wear N95 for patient esposure 2/2 patients TB status   Patient consented for risks of anesthesia including but not limited to:  - adverse reactions to medications - damage to eyes, teeth, lips or other oral mucosa - nerve damage due to positioning  - sore throat or hoarseness - Damage to heart, brain, nerves, lungs, other parts of body or loss of life  Patient voiced understanding.)        Anesthesia Quick Evaluation

## 2020-05-07 NOTE — Progress Notes (Addendum)
Subjective:  Patient seen earlier this morning prior to her surgery and has been NPO since midnight. She reports pain in affected breast and she continues to breastfeed from both breasts. Her questions have been answered. Her baby is 90 month old today.  Objective:  Vital signs in last 24 hours: Temp:  [96.9 F (36.1 C)-99.1 F (37.3 C)] 96.9 F (36.1 C) (01/29 1218) Pulse Rate:  [68-93] 77 (01/29 1238) Resp:  [16-20] 16 (01/29 1238) BP: (88-100)/(60-70) 93/62 (01/29 1230) SpO2:  [98 %-100 %] 100 % (01/29 1238)    General: NAD Pulmonary: no increased work of breathing Abdomen: non-distended, non-tender Extremities: no edema, no erythema, no tenderness Breast: Left normal. Right with erythema, firmness, tenderness on inner aspect  Results for orders placed or performed during the hospital encounter of 05/03/20 (from the past 72 hour(s))  Aerobic/Anaerobic Culture (surgical/deep wound)     Status: None (Preliminary result)   Collection Time: 05/04/20  2:12 PM   Specimen: Breast; Abscess  Result Value Ref Range   Specimen Description      BREAST Performed at [facility], [address], [place], [place] [address]    Special Requests      NONE Performed at [facility], [address], [place], [place] [address]    Gram Stain      ABUNDANT WBC PRESENT, PREDOMINANTLY PMN ABUNDANT GRAM POSITIVE COCCI Performed at [facility], [address] [place], [place] [address]    Culture      ABUNDANT METHICILLIN RESISTANT STAPHYLOCOCCUS AUREUS NO ANAEROBES ISOLATED; CULTURE IN PROGRESS FOR 5 DAYS    Report Status PENDING    Organism ID, Bacteria METHICILLIN RESISTANT STAPHYLOCOCCUS AUREUS       Susceptibility   Methicillin resistant staphylococcus aureus - MIC*    CIPROFLOXACIN >=8 RESISTANT Resistant     ERYTHROMYCIN >=8 RESISTANT Resistant     GENTAMICIN 8 INTERMEDIATE Intermediate     OXACILLIN >=4 RESISTANT Resistant     TETRACYCLINE <=1  SENSITIVE Sensitive     VANCOMYCIN <=0.5 SENSITIVE Sensitive     TRIMETH/SULFA 80 RESISTANT Resistant     CLINDAMYCIN RESISTANT Resistant     RIFAMPIN <=0.5 SENSITIVE Sensitive     Inducible Clindamycin POSITIVE Resistant     * ABUNDANT METHICILLIN RESISTANT STAPHYLOCOCCUS AUREUS  Basic metabolic panel     Status: Abnormal   Collection Time: 05/05/20  2:59 AM  Result Value Ref Range   Sodium 138 135 - 145 mmol/L   Potassium 4.4 3.5 - 5.1 mmol/L   Chloride 104 98 - 111 mmol/L   CO2 24 22 - 32 mmol/L   Glucose, Bld 130 (H) 70 - 99 mg/dL    Comment: Glucose reference range applies only to samples taken after fasting for at least 8 hours.   BUN 11 6 - 20 mg/dL   Creatinine, Ser 2.42 0.44 - 1.00 mg/dL   Calcium 9.3 8.9 - 35.3 mg/dL   GFR, Estimated >61 >44 mL/min    Comment: (NOTE) Calculated using the CKD-EPI Creatinine Equation (2021)    Anion gap 10 5 - 15    Comment: Performed at [facility], [address] [place], [place] [address]  Vancomycin, peak     Status: None   Collection Time: 05/07/20 12:16 AM  Result Value Ref Range   Vancomycin Pk 39 30 - 40 ug/mL    Comment: Performed at [facility], [address], [place], [place] [address]  CBC  Status: Abnormal   Collection Time: 05/07/20  6:25 AM  Result Value Ref Range   WBC 15.0 (H) 4.0 - 10.5 K/uL   RBC 4.15 3.87 - 5.11 MIL/uL   Hemoglobin 12.4 12.0 - 15.0 g/dL   HCT 58.8 50.2 - 77.4 %   MCV 89.4 80.0 - 100.0 fL   MCH 29.9 26.0 - 34.0 pg   MCHC 33.4 30.0 - 36.0 g/dL   RDW 12.8 78.6 - 76.7 %   Platelets 415 (H) 150 - 400 K/uL   nRBC 0.0 0.0 - 0.2 %    Comment: Performed at [facility], [address] [place], [place] [address]  Basic metabolic panel     Status: Abnormal   Collection Time: 05/07/20  6:25 AM  Result Value Ref Range   Sodium 139 135 - 145 mmol/L   Potassium 4.2 3.5 - 5.1 mmol/L   Chloride 103 98 - 111 mmol/L   CO2 26 22 - 32 mmol/L   Glucose, Bld 113 (H) 70 - 99  mg/dL    Comment: Glucose reference range applies only to samples taken after fasting for at least 8 hours.   BUN 12 6 - 20 mg/dL   Creatinine, Ser 0.96 0.44 - 1.00 mg/dL   Calcium 9.7 8.9 - 28.3 mg/dL   GFR, Estimated >66 >29 mL/min    Comment: (NOTE) Calculated using the CKD-EPI Creatinine Equation (2021)    Anion gap 10 5 - 15    Comment: Performed at [facility], [address] [place], [place] [address]  Vancomycin, trough     Status: Abnormal   Collection Time: 05/07/20  9:41 AM  Result Value Ref Range   Vancomycin Tr 10 (L) 15 - 20 ug/mL    Comment: Performed at [facility], [address] [place], [place] [address]  Creatinine, serum     Status: None   Collection Time: 05/07/20  9:41 AM  Result Value Ref Range   Creatinine, Ser 0.62 0.44 - 1.00 mg/dL   GFR, Estimated >46 >56 mL/min    Comment: (NOTE) Calculated using the CKD-EPI Creatinine Equation (2021) Performed at [facility], [address] [place], [place] [address]     Assessment:   30 y.o. G1P1001 mastitis with large abscess, MRSA, bacteria sensitive to Vancomycin  Plan:    1) I&D this morning 2) Resume regular diet after surgery 3) Continue IV Vancomycin 4) Continue breastfeeding 5) Disposition: per surgery recommendation   [name], CNM [facility] 05/07/2020, 1:25 PM

## 2020-05-07 NOTE — Transfer of Care (Signed)
Immediate Anesthesia Transfer of Care Note  Patient: Patricia Ashley  Procedure(s) Performed: INCISION AND DRAINAGE ABSCESS (Right Breast)  Patient Location: PACU  Anesthesia Type:General  Level of Consciousness: sedated  Airway & Oxygen Therapy: Patient Spontanous Breathing and Patient connected to face mask oxygen  Post-op Assessment: Report given to RN and Post -op Vital signs reviewed and stable  Post vital signs: Reviewed and stable  Last Vitals:  Vitals Value Taken Time  BP 88/62 05/07/20 1218  Temp 36.1 C 05/07/20 1218  Pulse 86 05/07/20 1218  Resp 16 05/07/20 1218  SpO2 100 % 05/07/20 1218  Vitals shown include unvalidated device data.  Last Pain:  Vitals:   05/07/20 1218  TempSrc:   PainSc: 0-No pain      Patients Stated Pain Goal: 0 (05/03/20 1958)  Complications: No complications documented.

## 2020-05-07 NOTE — Consult Note (Addendum)
Pharmacy Antibiotic Note  Patricia Ashley is a 30 y.o. female admitted on 05/03/2020 with cellulitis/Mastitis  Pharmacy has been consulted for Vancomyin dosing.  Patient with recent history of vaginal delivery 04/06/20. She subsequently developed pain in right breast, body aches, fever, and headaches around 1/12, and was started on po ABX on 1/15. Presented 1/25 after finishing oral ABX with noticeable redness tenderness and firmess to right breast. She underwent as aspiration by IR ib 1/26. Patient is breastfeeding.   Today, 05/07/2020  Day #4 vancomcyin  1/26 Breast aspiration - MRSA and possible anaerobe  1/29 I&D right breast  SCR stable  Vancomycin 1gm IV q12h dose due 2200  1/29 0030 Vanc peak = 39  1/29 0900 vanc trough = 10  AUC = 546  Plan:  Continue Vancomycin 1000 mg Q12H for goal AUC 400-550.  Expected AUC: 545  SCr used: 0.8  Check peak and trough with tonight's dose to calculate AUC  Monitor Scr  Follow up DOT s/p source control  Height: 5\' 2"  (157.5 cm) Weight: 71 kg (156 lb 8.4 oz) IBW/kg (Calculated) : 50.1  Temp (24hrs), Avg:97.9 F (36.6 C), Min:96.9 F (36.1 C), Max:99.1 F (37.3 C)  Recent Labs  Lab 05/03/20 1755 05/05/20 0259 05/07/20 0016 05/07/20 0625 05/07/20 0941  WBC 13.9*  --   --  15.0*  --   CREATININE 0.72 0.76  --  0.58 0.62  VANCOTROUGH  --   --   --   --  10*  VANCOPEAK  --   --  39  --   --     Estimated Creatinine Clearance: 95.8 mL/min (by C-G formula based on SCr of 0.62 mg/dL).    No Known Allergies  Antimicrobials this admission: Unasyn 1/25>>1/26 Fluconazole 1/25 x1 Vancomycin 1/26 >>   Microbiology results: 1/26 Breast: MRSA, possible anaerobe  Thank you for allowing pharmacy to be a part of this patient's care.  2/26, PharmD Pharmacy Resident  05/07/2020 2:44 PM

## 2020-05-07 NOTE — Progress Notes (Signed)
05/07/2020  Subjective: No acute events.  Patient reports some mild improvement in pain.  Scheduled for surgery today for I&D of right breast abscess.  Vital signs: Temp:  [98.3 F (36.8 C)-99.1 F (37.3 C)] 99.1 F (37.3 C) (01/29 0731) Pulse Rate:  [68-93] 89 (01/29 0731) Resp:  [18-20] 18 (01/29 0731) BP: (98-102)/(54-70) 100/70 (01/29 0731) SpO2:  [98 %-99 %] 98 % (01/29 0731)   Intake/Output: 01/28 0701 - 01/29 0700 In: 350.7 [I.V.:350.7] Out: -     Physical Exam: Constitutional: No acute distress Breast:  Right breast with stable erythema and induration consistent with her abscess.  No drainage.  Labs:  Recent Labs    05/07/20 0625  WBC 15.0*  HGB 12.4  HCT 37.1  PLT 415*   Recent Labs    05/05/20 0259 05/07/20 0625 05/07/20 0941  NA 138 139  --   K 4.4 4.2  --   CL 104 103  --   CO2 24 26  --   GLUCOSE 130* 113*  --   BUN 11 12  --   CREATININE 0.76 0.58 0.62  CALCIUM 9.3 9.7  --    No results for input(s): LABPROT, INR in the last 72 hours.  Imaging: US BREAST LTD UNI RIGHT INC AXILLA  Result Date: 05/06/2020 CLINICAL DATA:  Follow-up of right breast abscess. Status post aspiration revealing MRSA colonization. EXAM: ULTRASOUND OF THE RIGHT BREAST COMPARISON:  Previous exam(s). FINDINGS: Targeted right breast ultrasound is performed, showing persistent irregular heterogeneous fluid collection centered in the right 3 o'clock breast 9 cm from the nipple. The collection today measures 4.5 x 3.6 x 5.3 cm, which represents enlargement from the prior ultrasound dated May 04, 2020. Breast edema and hyperemia is noted. There is a marked persistent overlying skin thickening. A second pocket of complex fluid in the right breast 3 o'clock 12 cm from the nipple has also enlarged measuring 2.4 x 2.6 x 2.7 cm. IMPRESSION: Enlarging right breast abscesses with marked overlying skin thickening. RECOMMENDATION: Surgical consultation for treatment. I have discussed the  findings and recommendations with the patient. If applicable, a reminder letter will be sent to the patient regarding the next appointment. BI-RADS CATEGORY  2: Benign. Electronically Signed   By: Ted Mcalpine M.D.   On: 05/06/2020 16:26    Assessment/Plan: This is a 30 y.o. female with right breast abscess.  --Patient to go to OR this morning for I&D.  Discussed surgery at length with her and her husband.  All questions were answered.   Howie Ill, MD [facility]

## 2020-05-08 ENCOUNTER — Encounter: Payer: Self-pay | Admitting: Surgery

## 2020-05-08 LAB — CREATININE, SERUM
Creatinine, Ser: 0.58 mg/dL (ref 0.44–1.00)
GFR, Estimated: >60 mL/min (ref >60)

## 2020-05-08 MED ORDER — IBUPROFEN 600 MG PO TABS: 600.0000 mg | ORAL_TABLET | Freq: Three times a day (TID) | ORAL | 1 refills | Status: DC | PRN

## 2020-05-08 MED ORDER — ACETAMINOPHEN 500 MG PO TABS: 1000.0000 mg | ORAL_TABLET | Freq: Four times a day (QID) | ORAL | Status: DC | PRN

## 2020-05-08 MED ORDER — OXYCODONE HCL 5 MG PO TABS: 5.0000 mg | ORAL_TABLET | ORAL | 0 refills | Status: DC | PRN

## 2020-05-08 MED ORDER — DOXYCYCLINE HYCLATE 100 MG PO CAPS: 100.0000 mg | ORAL_CAPSULE | Freq: Two times a day (BID) | ORAL | 0 refills | Status: AC

## 2020-05-08 NOTE — Lactation Note (Signed)
Lactation Consultation Note  Patient Name: Patricia Ashley RSWNI'O Date: 05/08/2020 Reason for consult: Follow-up assessment Age:30 y.o.   Lactation assisted with feed on the right in side lying. Discussed and reviewed the breast needs to be stimulated when feeding with massage even if Mother is lying down. Mother is using a 70mm, LC resized R nipple to a 90mm. Nipple is vacuumed in the shield well, swallows noted. Mother stated she feels the breast getting softer. LC reviewed several times the baby needs to be eating every 2-3 hours for a minimum of 8 or more good feeds in 24 hours. If the baby is not feeding well then Mother will need to pump her breast. Reviewed rental pump from home, taking all supplies with her, resized flange to pump with 24 mm, may even need to purchase a 62mm, also reviewed use of manual handle for pumping. Parents were giving the pacifier when baby was cueing and it had been two hours since she had eaten. LC encouraged them to breast feed and not give pacifier if baby is cueing but to feed at breast instead. Parents stated understanding with all teaching.   Maternal Data Formula Feeding for Exclusion: No Has patient been taught Hand Expression?: Yes Does the patient have breastfeeding experience prior to this delivery?: No  Feeding Feeding Type: Breast Fed  LATCH Score Latch: Grasps breast easily, tongue down, lips flanged, rhythmical sucking.  Audible Swallowing: Spontaneous and intermittent  Type of Nipple: Everted at rest and after stimulation (using nipple shield due to previous damage. No damage at this time)  Comfort (Breast/Nipple): Soft / non-tender  Hold (Positioning): Assistance needed to correctly position infant at breast and maintain latch.  LATCH Score: 9  Interventions Interventions: Breast feeding basics reviewed;Assisted with latch;DEBP;Hand pump;Position options;Support pillows;Adjust position;Breast compression;Hand express;Breast massage  (using 33mm or 20 mm nipple shield)  Lactation Tools Discussed/Used Pump Education: Setup, frequency, and cleaning;Milk Storage   Consult Status Consult Status: PRN    Leelah Hanna D Rachele Lamaster 05/08/2020, 1:02 PM

## 2020-05-08 NOTE — Progress Notes (Signed)
Patient discharged to home. Medications/Prescriptions and discharge instructions reviewed with patient who verbalized understanding. Pt discharged with family via W/C. Will schedule follow-up as instructed. 

## 2020-05-08 NOTE — Progress Notes (Signed)
Dr Aleen Campi in to see pt this AM, dressing changed at bedside, surgical site appears to be improving, redness is decreasing, drainage is moderate, no odor, pink and not purulent. Pt will receive dose of vancomycin at 10 AM and can be discharged to home afterwards on PO doxycycline. MD reviewed dressing change instructions with pt (4x4 gauze, ABD pad and paper tape) and pt is ok to shower but not submerge incision. Pt will follow up with Dr. Aleen Campi at the office.

## 2020-05-08 NOTE — Discharge Summary (Signed)
Physician Final Progress Note  Patient ID: Patricia Ashley MRN: [id number] DOB/AGE: 05/12/1990 30 y.o.  Admit date: 05/03/2020 Admitting provider: Nadara Mustard, MD Discharge date: 05/08/2020   Admission Diagnoses: mastitis of right breast  Discharge Diagnoses:  Principal Problem:   Mastitis Active Problems:   Encounter for care and examination of lactating mother   Abscess of right breast   Acute mastitis of right breast   History of Present Illness: The patient is a 30 y.o. female G1P1001 who presents for worsening mastitis symptoms.  She is 1 month postpartum and has been breastfeeding. Patient was admitted for evaluation and treatment of mastitis. Initially she was treated with Unasyn. That was switched to Vancomycin. Ultrasound revealed 2 areas of abscess which initially were treated with aspiration. Follow up imaging revealed enlarging abscess and patient was taken to the OR for Incision and Drainage. She has received IV Vancomycin daily while inpatient. Dr Aleen Campi has examined the patient this morning and determined that she is well enough to discharge to home with instructions and supplies for dressing changes. She will have follow up with Dr Aleen Campi for post op and with [facility] for 6 week postpartum visit. The patient and her husband have received instructions and support from lactation daily while inpatient. They verbalize understanding of instructions. The patient denies pain or other concerns at this time. Encouraged use of pain medication as needed in order to allow for easier nursing and pumping of the affected breast.  Past Medical History:  Diagnosis Date  . 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 BREAST CYST ASPIRATION Right 05/04/2020  . PARTIAL HYMENECTOMY      No current facility-administered medications on file prior to encounter.   Current Outpatient Medications on File Prior to Encounter  Medication Sig  Dispense Refill  . Prenatal Vit-Fe Fumarate-FA (MULTIVITAMIN-PRENATAL) 27-0.8 MG TABS tablet Take 1 tablet by mouth daily at 12 noon.    . ferrous sulfate 325 (65 FE) MG tablet Take 1 tablet (325 mg total) by mouth every other day. (Patient not taking: No sig reported)  3    No Known Allergies  Social History   Socioeconomic History  . Marital status: Married    Spouse name: Not on file  . Number of children: Not on file  . Years of education: Not on file  . Highest education level: Not on file  Occupational History  . Not on file  Tobacco Use  . Smoking status: Never Smoker  . Smokeless tobacco: Never Used  Vaping Use  . Vaping Use: Never used  Substance and Sexual Activity  . Alcohol use: Never  . Drug use: Never  . Sexual activity: Not Currently  Other Topics Concern  . Not on file  Social History Narrative  . Not on file   Social Determinants of Health   Financial Resource Strain: Not on file  Food Insecurity: Not on file  Transportation Needs: Not on file  Physical Activity: Not on file  Stress: Not on file  Social Connections: Not on file  Intimate Partner Violence: Not on file    Family History  Problem Relation Age of Onset  . Congestive Heart Failure Mother   . Diabetes Mother   . Hypertension Mother   . Diabetes Father   . Hypertension Father      Review of Systems  Constitutional: Negative for chills and fever.  HENT: Negative for congestion, ear discharge, ear pain, hearing loss, sinus pain  and sore throat.   Eyes: Negative for blurred vision and double vision.  Respiratory: Negative for cough, shortness of breath and wheezing.   Cardiovascular: Negative for chest pain, palpitations and leg swelling.  Gastrointestinal: Negative for abdominal pain, blood in stool, constipation, diarrhea, heartburn, melena, nausea and vomiting.  Genitourinary: Negative for dysuria, flank pain, frequency, hematuria and urgency.  Musculoskeletal: Negative for back  pain, joint pain and myalgias.  Skin: Negative for itching and rash.  Neurological: Negative for dizziness, tingling, tremors, sensory change, speech change, focal weakness, seizures, loss of consciousness, weakness and headaches.  Endo/Heme/Allergies: Negative for environmental allergies. Does not bruise/bleed easily.  Psychiatric/Behavioral: Negative for depression, hallucinations, memory loss, substance abuse and suicidal ideas. The patient is not nervous/anxious and does not have insomnia.      Physical Exam: BP 102/85 (BP Location: Left Arm)   Pulse 73   Temp 97.8 F (36.6 C) (Oral)   Resp 18   Ht 5\' 2"  (1.575 m)   Wt 71 kg   SpO2 100% Comment: Room Air  BMI 28.63 kg/m   Constitutional: Well nourished, well developed female in no acute distress.  HEENT: normal Skin: Warm and dry.  Cardiovascular: Regular rate and rhythm.   Extremity: no edema  Respiratory: Clear to auscultation bilateral. Normal respiratory effort Abdomen: soft, nontender, nondistended, no abnormal masses, no epigastric pain Back: no CVAT Neuro: DTRs 2+, Cranial nerves grossly intact Psych: Alert and Oriented x3. No memory deficits. Normal mood and affect.    Consults: general surgery  Significant Findings/ Diagnostic Studies: labs:   Results for MALEEA, CAMILO (MRN Evorn Gong) as of 05/08/2020 11:27  Ref. Range 05/06/2020 14:22 05/07/2020 00:16 05/07/2020 06:25 05/07/2020 09:41 05/08/2020 04:59  BASIC METABOLIC PANEL Unknown   Rpt (A)    Sodium Latest Ref Range: 135 - 145 mmol/L   139    Potassium Latest Ref Range: 3.5 - 5.1 mmol/L   4.2    Chloride Latest Ref Range: 98 - 111 mmol/L   103    CO2 Latest Ref Range: 22 - 32 mmol/L   26    Glucose Latest Ref Range: 70 - 99 mg/dL   05/10/2020 (H)    BUN Latest Ref Range: 6 - 20 mg/dL   12    Creatinine Latest Ref Range: 0.44 - 1.00 mg/dL   756 4.33 2.95  Calcium Latest Ref Range: 8.9 - 10.3 mg/dL   9.7    Anion gap Latest Ref Range: 5 - 15    10    GFR,  Estimated Latest Ref Range: >60 mL/min   >60 >60 >60  WBC Latest Ref Range: 4.0 - 10.5 K/uL   15.0 (H)    RBC Latest Ref Range: 3.87 - 5.11 MIL/uL   4.15    Hemoglobin Latest Ref Range: 12.0 - 15.0 g/dL   1.88    HCT Latest Ref Range: 36.0 - 46.0 %   37.1    MCV Latest Ref Range: 80.0 - 100.0 fL   89.4    MCH Latest Ref Range: 26.0 - 34.0 pg   29.9    MCHC Latest Ref Range: 30.0 - 36.0 g/dL   41.6    RDW Latest Ref Range: 11.5 - 15.5 %   12.6    Platelets Latest Ref Range: 150 - 400 K/uL   415 (H)    nRBC Latest Ref Range: 0.0 - 0.2 %   0.0    Vancomycin Pk Latest Ref Range: 30 - 40 ug/mL  39  Vancomycin Tr Latest Ref Range: 15 - 20 ug/mL    10 (L)   US BREAST LTD UNI RIGHT INC AXILLA Unknown Rpt       CLINICAL DATA:  Follow-up of right breast abscess.  Status post aspiration revealing MRSA colonization.  EXAM: ULTRASOUND OF THE RIGHT BREAST  COMPARISON:  Previous exam(s).  FINDINGS: Targeted right breast ultrasound is performed, showing persistent irregular heterogeneous fluid collection centered in the right 3 o'clock breast 9 cm from the nipple. The collection today measures 4.5 x 3.6 x 5.3 cm, which represents enlargement from the prior ultrasound dated May 04, 2020. Breast edema and hyperemia is noted. There is a marked persistent overlying skin thickening. A second pocket of complex fluid in the right breast 3 o'clock 12 cm from the nipple has also enlarged measuring 2.4 x 2.6 x 2.7 cm.  IMPRESSION: Enlarging right breast abscesses with marked overlying skin thickening.  RECOMMENDATION: Surgical consultation for treatment.  I have discussed the findings and recommendations with the patient. If applicable, a reminder letter will be sent to the patient regarding the next appointment.  BI-RADS CATEGORY  2: Benign.  Electronically Signed   By: Ted Mcalpine M.D.   On: 05/06/2020 16:26   Procedures: Aspiration of breast abscess, I&D of  breast abscess  Hospital Course: The patient was admitted to Labor and Delivery Triage for observation.   Discharge Condition: good  Disposition: Discharge disposition: 01-Home or Self Care  Diet: Regular diet  Discharge Activity: Increase activity as able  Discharge Instructions    Call MD for:  difficulty breathing, headache or visual disturbances   Complete by: As directed    Call MD for:  persistant nausea and vomiting   Complete by: As directed    Call MD for:  redness, tenderness, or signs of infection (pain, swelling, redness, odor or green/yellow discharge around incision site)   Complete by: As directed    Call MD for:  severe uncontrolled pain   Complete by: As directed    Call MD for:  temperature >100.4   Complete by: As directed    Diet general   Complete by: As directed    Regular diet.   Discharge instructions   Complete by: As directed    1.  Patient may shower, but do not scrub wounds heavily and dab dry only. 2.  Do not submerge wounds in pool/tub until all the wounds are fully healed. 3.  Do not remove the penrose drains. 4.  Dressing:  Apply dry gauze over the wounds, followed by ABD pad, and secure in place with tape.  Change once daily and as needed to keep the area clean and dry.   Discharge wound care:   Complete by: As directed    Per instructions from Dr Aleen Campi   Driving Restrictions   Complete by: As directed    Do not drive while taking narcotics for pain control.   Increase activity slowly   Complete by: As directed    Activity as tolerated   Increase activity slowly   Complete by: As directed      Allergies as of 05/08/2020   No Known Allergies     Medication List    TAKE these medications   acetaminophen 500 MG tablet Commonly known as: TYLENOL Take 2 tablets (1,000 mg total) by mouth every 6 (six) hours as needed for mild pain. What changed:   medication strength  how much to take  when to take this  reasons  to take this    doxycycline 100 MG capsule Commonly known as: VIBRAMYCIN Take 1 capsule (100 mg total) by mouth 2 (two) times daily for 10 days.   ferrous sulfate 325 (65 FE) MG tablet Take 1 tablet (325 mg total) by mouth every other day.   ibuprofen 600 MG tablet Commonly known as: ADVIL Take 1 tablet (600 mg total) by mouth every 8 (eight) hours as needed for moderate pain. What changed:   when to take this  reasons to take this   multivitamin-prenatal 27-0.8 MG Tabs tablet Take 1 tablet by mouth daily at 12 noon.   oxyCODONE 5 MG immediate release tablet Commonly known as: Oxy IR/ROXICODONE Take 1 tablet (5 mg total) by mouth every 4 (four) hours as needed for severe pain.            Discharge Care Instructions  (From admission, onward)         Start     Ordered   05/08/20 0000  Discharge wound care:       Comments: Per instructions from Dr Aleen Campi   05/08/20 1114          Follow-up Information    Henrene Dodge, MD Follow up in 1 week(s).   Specialty: General Surgery Contact information: [address] [place] [address] [phone]        Conard Novak, MD. Schedule an appointment as soon as possible for a visit.   Specialty: Obstetrics and Gynecology Why: postpartum follow up visit, birth control, discussion re. MRSA Contact information: [address] [place] [address] [phone]               Total time spent taking care of this patient: 35 minutes  Signed: [name], CNM  05/08/2020, 11:15 AM

## 2020-05-08 NOTE — Progress Notes (Signed)
05/08/2020  Subjective: Patient is 1 Day Post-Op s/p I&D of right breast abscess.  No acute events.  Pain well controlled.  Dressing change last night with some serosanguinous drainage.  Vital signs: Temp:  [96.9 F (36.1 C)-98.7 F (37.1 C)] 97.8 F (36.6 C) (01/30 0826) Pulse Rate:  [62-92] 73 (01/30 0826) Resp:  [7-21] 18 (01/30 0200) BP: (83-108)/(61-85) 102/85 (01/30 0826) SpO2:  [97 %-100 %] 100 % (01/30 0826)   Intake/Output: 01/29 0701 - 01/30 0700 In: 1549.3 [I.V.:1249.3; IV Piggyback:300] Out: 5 [Blood:5]    Physical Exam: Constitutional: No acute distress Breast:  Right breast with three incisions and 2 penrose drains.  There is still some erythema and induration, but has improved.  Serosanguinous drainage on dressing.  New gauze dressing applied.  Labs:  Recent Labs    05/07/20 0625  WBC 15.0*  HGB 12.4  HCT 37.1  PLT 415*   Recent Labs    05/07/20 0625 05/07/20 0941 05/08/20 0459  NA 139  --   --   K 4.2  --   --   CL 103  --   --   CO2 26  --   --   GLUCOSE 113*  --   --   BUN 12  --   --   CREATININE 0.58 0.62 0.58  CALCIUM 9.7  --   --    No results for input(s): LABPROT, INR in the last 72 hours.  Imaging: No results found.  Assessment/Plan: This is a 30 y.o. female s/p I&D of right breast abscess.  --Discussed with the patient and her husband again the findings during surgery.  Three incisions made for better drainage of a larger cavity, with two penrose drains to allow for continued drainage until the cavity heals better.  Went over instructions for dressing changes and post-op care. --Can d/c home today from surgical standpoint.  Have written Rx for Doxycycline 10 day course, Oxycodone, and Ibuprofen and sent to her pharmacy electronically, as well as D/C instructions.  Follow up in a week.   Howie Ill, MD  [facility]

## 2020-05-08 NOTE — Progress Notes (Signed)
Pt discharged via wheel chair.

## 2020-05-09 ENCOUNTER — Telehealth: Payer: Self-pay

## 2020-05-09 LAB — AEROBIC/ANAEROBIC CULTURE W GRAM STAIN (SURGICAL/DEEP WOUND)

## 2020-05-09 NOTE — Telephone Encounter (Signed)
A hosp f/u has been scheduled for 05/16/20 @ 2 pm. Pt notified and verbalizes understanding.

## 2020-05-12 ENCOUNTER — Telehealth: Payer: Self-pay

## 2020-05-12 NOTE — Telephone Encounter (Signed)
Pt's [facility]sb calling; constipation.  [phone]  Adv fiber supplements such as metamucil, fibercon, citrucel. Increasing fiber intake via bran cereals, oatmeal, leafy greens, and prunes is another alternative.  Increase fluid intake.  May try colace 100mg  BID; it will work better if drinking 64 oz water a day and eating fresh fruits and vegetables.  May also try prune juice; some pts heat the prune juice and has good results.

## 2020-05-16 ENCOUNTER — Ambulatory Visit (INDEPENDENT_AMBULATORY_CARE_PROVIDER_SITE_OTHER): Payer: 59 | Admitting: Surgery

## 2020-05-16 ENCOUNTER — Other Ambulatory Visit: Payer: Self-pay

## 2020-05-16 ENCOUNTER — Encounter: Payer: Self-pay | Admitting: Surgery

## 2020-05-16 VITALS — BP 112/76 | HR 83 | Temp 98.0°F | Ht 62.0 in | Wt 162.0 lb

## 2020-05-16 DIAGNOSIS — N611 Abscess of the breast and nipple: Secondary | ICD-10-CM

## 2020-05-16 DIAGNOSIS — Z09 Encounter for follow-up examination after completed treatment for conditions other than malignant neoplasm: Secondary | ICD-10-CM

## 2020-05-16 NOTE — Progress Notes (Signed)
For your review since she is following up with you later this week

## 2020-05-16 NOTE — Patient Instructions (Signed)
Please see your follow up appointment listed below.  You may shower as usual. Allow soapy water to run over the wounds. Pat dry and place a dry dressing over the area.

## 2020-05-16 NOTE — Progress Notes (Signed)
05/16/2020  HPI: Patricia Ashley is a 30 y.o. female s/p I&D of large right breast abscess on 05/07/20.  Two penrose drains were left in place.  Presents today for follow up.  She's doing better.  Almost done with Doxy.  Pain, redness, and swelling much improved.  Vital signs: BP 112/76   Pulse 83   Temp 98 F (36.7 C) (Oral)   Ht 5\' 2"  (1.575 m)   Wt 162 lb (73.5 kg)   SpO2 96%   BMI 29.63 kg/m    Physical Exam: Constitutional: No acute distress Breast:  Right breast s/p I&D with three incisions and two penrose drains.  Healing well, without purulent drainage.  No further erythema, and much improved induration.  There's only some small firmness towards the medial portion of the breast, but again, much improved compared to preop.  Assessment/Plan: This is a 30 y.o. female s/p I&D of right breast abscess.  --Patietn is healing well, lateral drain removed today without complications.  Dry gauze dressing applied. --Continue Doxy until completed. --Follow up 1 week. --Patient is wondering how to get rid of the MRSA that grew on her cultures.  She may be referring to decolonization.  She has a follow up with Dr. 37 this week and will send him a message to see if he knows the protocol, vs she may need ID referral for this.   Jean Rosenthal, MD [facility]

## 2020-05-20 ENCOUNTER — Encounter: Payer: Self-pay | Admitting: Obstetrics and Gynecology

## 2020-05-20 ENCOUNTER — Ambulatory Visit (INDEPENDENT_AMBULATORY_CARE_PROVIDER_SITE_OTHER): Payer: 59 | Admitting: Obstetrics and Gynecology

## 2020-05-20 ENCOUNTER — Other Ambulatory Visit: Payer: Self-pay

## 2020-05-20 DIAGNOSIS — Z22322 Carrier or suspected carrier of Methicillin resistant Staphylococcus aureus: Secondary | ICD-10-CM

## 2020-05-20 MED ORDER — MUPIROCIN CALCIUM 2 % NA OINT: 1.0000 "application " | TOPICAL_OINTMENT | Freq: Two times a day (BID) | NASAL | 2 refills | Status: AC

## 2020-05-20 NOTE — Patient Instructions (Addendum)
For Consipation:  Miralax 1 dose per day (17 grams) mixed in whatever drink you wish  What is the management of recurrent MRSA SSTIs? Recurrent SSTIs   12. Preventive educational messages on personal hygiene and appropriate wound care are recommended for all patients with SSTI. Instructions should be provided to:   i. Keep draining wounds covered with clean, dry bandages (A-III).   ii. Maintain good personal hygiene with regular bathing and cleaning of hands with soap and water or an alcohol-based hand gel, particularly after touching infected skin or an item that has directly contacted a draining wound (A-III).   iii. Avoid reusing or sharing personal items (eg, disposable razors, linens, and towels) that have contacted infected skin (A-III).   13. Environmental hygiene measures should be considered in patients with recurrent SSTI in the household or community setting:   i. Focus cleaning efforts on high-touch surfaces (ie, surfaces that come into frequent contact with people's bare skin each day, such as counters, door knobs, bath tubs, and toilet seats) that may contact bare skin or uncovered infections (C-III).  ii. Commercially available cleaners or detergents appropriate for the surface being cleaned should be used according to label instructions for routine cleaning of surfaces (C-III).   14. Decolonization may be considered in selected cases if:   i. A patient develops a recurrent SSTI despite optimizing wound care and hygiene measures (C-III).  ii. Ongoing transmission is occurring among household members or other close contacts despite optimizing wound care and hygiene measures (C-III).   15. Decolonization strategies should be offered in conjunction with ongoing reinforcement of hygiene measures and may include the following:   i. Nasal decolonization with mupirocin twice daily for 5-10 days (C-III).  ii. Nasal decolonization with mupirocin twice daily for 5-10 days and topical  body decolonization regimens with a skin antiseptic solution (eg, chlorhexidine) for 5-14 days or dilute bleach baths. (For dilute bleach baths, 1 teaspoon per gallon of water [or  cup per  tub or 13 gallons of water] given for 15 min twice weekly for ~3 months can be considered.) (C-III).   16. Oral antimicrobial therapy is recommended for the treatment of active infection only and is not routinely recommended for decolonization (A-III). An oral agent in combination with rifampin, if the strain is susceptible, may be considered for decolonization if infections recur despite above measures (CIII).   17. In cases where household or interpersonal transmission is suspected:   i. Personal and environmental hygiene measures in the patient and contacts are recommended (A-III). ii. Contacts should be evaluated for evidence of S. aureus infection:  a. Symptomatic contacts should be evaluated and treated (A-III); nasal and topical body decolonization strategies may be considered following treatment of active infection (C-III).  b. Nasal and topical body decolonization of asymptomatic household contacts may be considered (C-III).   18. The role of cultures in the management of patients with recurrent SSTI is limited:   i. Screening cultures prior to decolonization are not routinely recommended if at least 1 of the prior infections was documented as due to MRSA (B-III).  ii. Surveillance cultures following a decolonization regimen are not routinely recommended in the absence of an active infection (B-III).

## 2020-05-20 NOTE — Progress Notes (Signed)
Postpartum Visit  Chief Complaint:  Chief Complaint  Patient presents with  . Postpartum Care    History of Present Illness: Patient is a 30 y.o. G1P1001 presents for postpartum visit.  Date of delivery: 04/06/2020 Type of delivery: Vaginal delivery - Vacuum or forceps assisted  no Episiotomy No.  Laceration: 2nd degree Pregnancy or labor problems:  no Any problems since the delivery:  Pt had surgery for breast abscess 05/16/2020, MRSA positive treated with I&D. She was treated with Vancomycin in the hospital and was discharged on doxycycline.    Newborn Details:  SINGLETON :  1. Birth weight: 7.2lb Maternal Details:  Breast Feeding:  yes Post partum depression/anxiety noted:  no Edinburgh Post-Partum Depression Score:  5  Date of last PAP: 12/18/2019 - NILM  Past Medical History:  Diagnosis Date  . 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 BREAST CYST ASPIRATION Right 05/04/2020  . INCISION AND DRAINAGE ABSCESS Right 05/07/2020   Procedure: INCISION AND DRAINAGE ABSCESS;  Surgeon: Henrene Dodge, MD;  Location: [facility] ORS;  Service: General;  Laterality: Right;  . PARTIAL HYMENECTOMY      Prior to Admission medications   Medication Sig Start Date End Date Taking? Authorizing Provider  Prenatal Vit-Fe Fumarate-FA (MULTIVITAMIN-PRENATAL) 27-0.8 MG TABS tablet Take 1 tablet by mouth daily at 12 noon.   Yes [provider]   Allergies: No Known Allergies   Social History   Socioeconomic History  . Marital status: Married    Spouse name: Not on file  . Number of children: Not on file  . Years of education: Not on file  . Highest education level: Not on file  Occupational History  . Not on file  Tobacco Use  . Smoking status: Never Smoker  . Smokeless tobacco: Never Used  Vaping Use  . Vaping Use: Never used  Substance and Sexual Activity  . Alcohol use: Never  . Drug use: Never  . Sexual activity: Not Currently   Other Topics Concern  . Not on file  Social History Narrative  . Not on file   Social Determinants of Health   Financial Resource Strain: Not on file  Food Insecurity: Not on file  Transportation Needs: Not on file  Physical Activity: Not on file  Stress: Not on file  Social Connections: Not on file  Intimate Partner Violence: Not on file    Family History  Problem Relation Age of Onset  . Congestive Heart Failure Mother   . Diabetes Mother   . Hypertension Mother   . Diabetes Father   . Hypertension Father     Review of Systems  Constitutional: Negative.   HENT: Negative.   Eyes: Negative.   Respiratory: Negative.   Cardiovascular: Negative.   Gastrointestinal: Negative.   Genitourinary: Negative.   Musculoskeletal: Negative.   Skin: Negative.   Neurological: Negative.   Psychiatric/Behavioral: Negative.      Physical Exam BP 122/70   Ht 5\' 2"  (1.575 m)   Wt 162 lb (73.5 kg)   BMI 29.63 kg/m   Physical Exam Constitutional:      General: She is not in acute distress.    Appearance: Normal appearance. She is well-developed.  Genitourinary:     Vulva, bladder and urethral meatus normal.     Right Labia: No rash, tenderness, lesions, skin changes or Bartholin's cyst.    Left Labia: No tenderness, skin changes, Bartholin's cyst or rash.    No inguinal adenopathy  present in the right or left side.    Pelvic Tanner Score: 5/5.     Right Adnexa: not tender, not full and no mass present.    Left Adnexa: not tender, not full and no mass present.    No cervical motion tenderness, friability, lesion or polyp.     Uterus is not enlarged, fixed or tender.     Uterus is anteverted.     No urethral tenderness or mass present.     Pelvic exam was performed with patient in the lithotomy position.  HENT:     Head: Normocephalic and atraumatic.  Eyes:     General: No scleral icterus.    Conjunctiva/sclera: Conjunctivae normal.  Cardiovascular:     Rate and Rhythm:  Normal rate and regular rhythm.     Heart sounds: No murmur heard. No friction rub. No gallop.   Pulmonary:     Effort: Pulmonary effort is normal. No respiratory distress.     Breath sounds: Normal breath sounds. No wheezing or rales.  Abdominal:     General: Bowel sounds are normal. There is no distension.     Palpations: Abdomen is soft. There is no mass.     Tenderness: There is no abdominal tenderness. There is no guarding or rebound.     Hernia: There is no hernia in the left inguinal area or right inguinal area.  Musculoskeletal:        General: Normal range of motion.     Cervical back: Normal range of motion and neck supple.  Lymphadenopathy:     Lower Body: No right inguinal adenopathy. No left inguinal adenopathy.  Neurological:     General: No focal deficit present.     Mental Status: She is alert and oriented to person, place, and time.     Cranial Nerves: No cranial nerve deficit.  Skin:    General: Skin is warm and dry.     Findings: No erythema.  Psychiatric:        Mood and Affect: Mood normal.        Behavior: Behavior normal.        Judgment: Judgment normal.      Female Chaperone present during breast and/or pelvic exam.  Assessment: 30 y.o. G1P1001 presenting for 6 week postpartum visit  Plan: Problem List Items Addressed This Visit   None   Visit Diagnoses    Postpartum care and examination    -  Primary   Relevant Medications   mupirocin nasal ointment (BACTROBAN) 2 %   MRSA (methicillin resistant Staphylococcus aureus) colonization       Relevant Medications   mupirocin nasal ointment (BACTROBAN) 2 %     1) Contraception: Declines  2)  Pap - ASCCP guidelines and rational discussed.  Patient opts for routine screening interval  3) Patient underwent screening for postpartum depression with no concerns noted.  4) MRSA infection/colonization: mupirocin ointment BID x 5-12 days, CHG baths twice daily for 5-14 days  5) Follow up 1 year for  routine annual exam   Thomasene Mohair, MD 05/20/2020 3:29 PM

## 2020-05-23 ENCOUNTER — Other Ambulatory Visit: Payer: Self-pay

## 2020-05-23 ENCOUNTER — Encounter: Payer: Self-pay | Admitting: Surgery

## 2020-05-23 ENCOUNTER — Ambulatory Visit (INDEPENDENT_AMBULATORY_CARE_PROVIDER_SITE_OTHER): Payer: 59 | Admitting: Surgery

## 2020-05-23 VITALS — BP 105/68 | HR 72 | Temp 98.0°F | Ht 62.0 in | Wt 160.0 lb

## 2020-05-23 DIAGNOSIS — N611 Abscess of the breast and nipple: Secondary | ICD-10-CM

## 2020-05-23 DIAGNOSIS — Z09 Encounter for follow-up examination after completed treatment for conditions other than malignant neoplasm: Secondary | ICD-10-CM

## 2020-05-23 NOTE — Patient Instructions (Addendum)
If you have any concerns or questions, please feel free to contact our office. You will need to keep a dressing on until it heals and starts to close. See your follow up appointment below.

## 2020-05-23 NOTE — Progress Notes (Signed)
05/23/2020  HPI: Patricia Ashley is a 30 y.o. female s/p I&D of right breast abscess on 05/07/20.  Cultures positive for MRSA and was discharged on Doxycycline.  She's following decolonization protocols with Dr. Rivka Safer.  Last seen in office on 2/7 at which time one of the penrose drains was removed.  Today, she continues to do well.  There's still an area of firmness in the breast but it is not tender.  She's having some yellow fluid drainage from the wounds.  Denies any fevers, worsening pain, redness.  Vital signs: BP 105/68   Pulse 72   Temp 98 F (36.7 C) (Oral)   Ht 5\' 2"  (1.575 m)   Wt 160 lb (72.6 kg)   SpO2 98%   BMI 29.26 kg/m    Physical Exam: Constitutional: No acute distress Breast:  Right breast s/p I&D with three small incisions and one penrose drain medially.  Drain was removed without complication.  No purulent drainage.  The yellow drainage is fat necrosis fluid.  There is a small, 1.5 cm area of firmness in between the two medial incisions, but I think it's smaller than last week and there is no surrounding erythema or induration of the skin.  Assessment/Plan: This is a 30 y.o. female s/p I&D of right breast abscess.  --Remaining drain removed today.  No complications. --No signs of recurrent or worsening infection.  Area of firmness is likely scar tissue vs some residual inflammation.  No need for any procedures at this point. --Dry dressing change daily over the wounds. --Follow up in three weeks.   37, MD [facility]

## 2020-05-30 ENCOUNTER — Telehealth: Payer: Self-pay

## 2020-05-30 ENCOUNTER — Ambulatory Visit: Payer: Self-pay | Admitting: Obstetrics

## 2020-05-30 NOTE — Telephone Encounter (Signed)
Husband calling again.  Sounds like it's the left breast, not sure.   Pt coming in at 11:30 tomorrow c CRS.

## 2020-05-30 NOTE — Telephone Encounter (Signed)
Pt's [facility] calling; pt has pain in one of her breasts and has been itching for 3-4 days.  [phone]

## 2020-05-31 ENCOUNTER — Encounter: Payer: Self-pay | Admitting: Obstetrics and Gynecology

## 2020-05-31 ENCOUNTER — Other Ambulatory Visit: Payer: Self-pay

## 2020-05-31 ENCOUNTER — Ambulatory Visit (INDEPENDENT_AMBULATORY_CARE_PROVIDER_SITE_OTHER): Payer: 59 | Admitting: Obstetrics and Gynecology

## 2020-05-31 VITALS — BP 118/72 | Ht 62.0 in | Wt 161.8 lb

## 2020-05-31 DIAGNOSIS — N644 Mastodynia: Secondary | ICD-10-CM | POA: Diagnosis not present

## 2020-05-31 NOTE — Patient Instructions (Signed)
Lanolin topical ointment What is this medicine? LANOLIN (lan o lin) is used on the skin to treat or prevent minor skin irritations such as blisters, burns, dry skin, and diaper rash. This medicine may be used for other purposes; ask your health care provider or pharmacist if you have questions. COMMON BRAND NAME(S): LanaShield, Soothe & Cool What should I tell my health care provider before I take this medicine? They need to know if you have any of these conditions:  an unusual or allergic reaction to lanolin, other medicines, foods, dyes, or preservatives  pregnant or trying to get pregnant  breast-feeding How should I use this medicine? This medicine is for external use only. Do not take by mouth. Follow the directions on the prescription or product label. Wash your hands before and after use. Apply a generous amount to the affected area. Do not cover with a bandage or dressing unless your doctor or health care professional tells you to. Do not get this medicine in your eyes. If you do, rinse out with plenty of cool tap water. Talk to your pediatrician regarding the use of this medicine in children. While this drug may be prescribed for selected conditions, precautions do apply. Overdosage: If you think you have taken too much of this medicine contact a poison control center or emergency room at once. NOTE: This medicine is only for you. Do not share this medicine with others. What if I miss a dose? If you miss a dose, use it as soon as you can. If it is almost time for your next dose, use only that dose. Do not use double or extra doses. What may interact with this medicine? Interactions are not expected. Do not use other skin products at the same site without asking your doctor or health care professional. This list may not describe all possible interactions. Give your health care provider a list of all the medicines, herbs, non-prescription drugs, or dietary supplements you use. Also tell  them if you smoke, drink alcohol, or use illegal drugs. Some items may interact with your medicine. What should I watch for while using this medicine? Tell your doctor or health care professional if the area you are treating does not get better within a week. What side effects may I notice from receiving this medicine? Side effects that you should report to your doctor or health care professional as soon as possible:  allergic reactions like skin rash, itching or hives, swelling of the face, lips, or tongue This list may not describe all possible side effects. Call your doctor for medical advice about side effects. You may report side effects to FDA at 1-800-FDA-1088. Where should I keep my medicine? Keep out of the reach of children. Store at room temperature. Keep closed while not in use. Throw away an unused medicine after the expiration date. NOTE: This sheet is a summary. It may not cover all possible information. If you have questions about this medicine, talk to your doctor, pharmacist, or health care provider.  2021 Elsevier/Gold Standard (2015-07-01 11:40:30)

## 2020-05-31 NOTE — Progress Notes (Signed)
Patient ID: Patricia Ashley, female   DOB: Jan 15, 1991, 30 y.o.   MRN: [id number]  Reason for Consult: Gynecologic Exam   Referred by Defrancesco, Prentice Docker, *  Subjective:     HPI:  Patricia Ashley is a 30 y.o. female. She recently had a complicated right breast mastitis. She has been recovering at home and continued to breast feed.She has completed her oral antibiotics.  She noted some pain in her right breast for the last 1-2 days. She was concerned regarding an reoccurrence of mastitis. She denies body aches or fevers. No skin changes to the breast. She has had some general itching of the breast.     Past Medical History:  Diagnosis Date  . 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 Congestive Heart Failure Mother   . Diabetes Mother   . Hypertension Mother   . Diabetes Father   . Hypertension Father    Past Surgical History:  Procedure Laterality Date  . BREAST CYST ASPIRATION Right 05/04/2020  . INCISION AND DRAINAGE ABSCESS Right 05/07/2020   Procedure: INCISION AND DRAINAGE ABSCESS;  Surgeon: Henrene Dodge, MD;  Location: [facility] ORS;  Service: General;  Laterality: Right;  . PARTIAL HYMENECTOMY      Short Social History:  Social History   Tobacco Use  . Smoking status: Never Smoker  . Smokeless tobacco: Never Used  Substance Use Topics  . Alcohol use: Never    No Known Allergies  Current Outpatient Medications  Medication Sig Dispense Refill  . mupirocin nasal ointment (BACTROBAN) 2 % Place 1 application into the nose 2 (two) times daily. Use one-half of tube in each nostril twice daily for five (5) days. After application, press sides of nose together and gently massage. 10 g 2  . Prenatal Vit-Fe Fumarate-FA (MULTIVITAMIN-PRENATAL) 27-0.8 MG TABS tablet Take 1 tablet by mouth daily at 12 noon.     No current facility-administered medications for this visit.    Review of Systems  Constitutional: Negative for chills,  fatigue, fever and unexpected weight change.  HENT: Negative for trouble swallowing.  Eyes: Negative for loss of vision.  Respiratory: Negative for cough, shortness of breath and wheezing.  Cardiovascular: Negative for chest pain, leg swelling, palpitations and syncope.  GI: Negative for abdominal pain, blood in stool, diarrhea, nausea and vomiting.  GU: Negative for difficulty urinating, dysuria, frequency and hematuria.  Musculoskeletal: Negative for back pain, leg pain and joint pain.  Skin: Negative for rash.  Neurological: Negative for dizziness, headaches, light-headedness, numbness and seizures.  Psychiatric: Negative for behavioral problem, confusion, depressed mood and sleep disturbance.        Objective:  Objective   Vitals:   05/31/20 1140  BP: 118/72  Weight: 161 lb 12.8 oz (73.4 kg)  Height: 5\' 2"  (1.575 m)   Body mass index is 29.59 kg/m.  Physical Exam Vitals and nursing note reviewed. Exam conducted with a chaperone present.  Constitutional:      Appearance: She is well-developed and well-nourished.  HENT:     Head: Normocephalic and atraumatic.  Eyes:     Extraocular Movements: EOM normal.     Pupils: Pupils are equal, round, and reactive to light.  Cardiovascular:     Rate and Rhythm: Normal rate and regular rhythm.  Pulmonary:     Effort: Pulmonary effort is normal. No respiratory distress.  Chest:  Breasts:     Right: Normal.     Left: Normal.  Comments: Two small healing incision. Appear well healed. No drainage, no erythema. Intact Skin:    General: Skin is warm and dry.  Neurological:     Mental Status: She is alert and oriented to person, place, and time.  Psychiatric:        Mood and Affect: Mood and affect normal.        Behavior: Behavior normal.        Thought Content: Thought content normal.        Judgment: Judgment normal.     Assessment/Plan:     30 yo with recent mastitis No evidence of infection today on exam.  Reassured patient. Patient to continue and monitor and notify if she has worsening symptoms.  Recommended wearing a loose fitting bra for support of breast.  Continue warm and cold compresses Discussed lanolin ointment to help with maintaining skin integrity and moisture.   Follow up in 1 week for continued monitoring with Dr. Jean Rosenthal who is familiar with the patient.    Adelene Idler MD [facility], [facility] 05/31/2020 11:54 AM

## 2020-06-06 ENCOUNTER — Other Ambulatory Visit: Payer: Self-pay

## 2020-06-06 ENCOUNTER — Encounter: Payer: Self-pay | Admitting: Obstetrics and Gynecology

## 2020-06-06 ENCOUNTER — Ambulatory Visit (INDEPENDENT_AMBULATORY_CARE_PROVIDER_SITE_OTHER): Payer: 59 | Admitting: Obstetrics and Gynecology

## 2020-06-06 VITALS — BP 112/70 | Ht 62.0 in

## 2020-06-06 DIAGNOSIS — Z22322 Carrier or suspected carrier of Methicillin resistant Staphylococcus aureus: Secondary | ICD-10-CM

## 2020-06-06 DIAGNOSIS — N644 Mastodynia: Secondary | ICD-10-CM

## 2020-06-06 NOTE — Progress Notes (Signed)
Obstetrics & Gynecology Office Visit    Chief Complaint  Patient presents with  . Follow-up  right  Breast mastitis  History of Present Illness: 30 y.o. G37P1001 female who is postpartum and had a postpartum course complicated by mastitis that was drained by general surgery on 05/07/20.  She has been concerned about a knot in the area of her right breast where the abscess was drained. She denies fevers, chills, tenderness in the area, changes in skin color, drainage from the incision site.      Past Medical History:  Diagnosis Date  . 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 BREAST CYST ASPIRATION Right 05/04/2020  . INCISION AND DRAINAGE ABSCESS Right 05/07/2020   Procedure: INCISION AND DRAINAGE ABSCESS;  Surgeon: Henrene Dodge, MD;  Location: [facility] ORS;  Service: General;  Laterality: Right;  . PARTIAL HYMENECTOMY      Gynecologic History: No LMP recorded.  Obstetric History: G1P1001  Family History  Problem Relation Age of Onset  . Congestive Heart Failure Mother   . Diabetes Mother   . Hypertension Mother   . Diabetes Father   . Hypertension Father     Social History   Socioeconomic History  . Marital status: Married    Spouse name: Not on file  . Number of children: Not on file  . Years of education: Not on file  . Highest education level: Not on file  Occupational History  . Not on file  Tobacco Use  . Smoking status: Never Smoker  . Smokeless tobacco: Never Used  Vaping Use  . Vaping Use: Never used  Substance and Sexual Activity  . Alcohol use: Never  . Drug use: Never  . Sexual activity: Not Currently  Other Topics Concern  . Not on file  Social History Narrative  . Not on file   Social Determinants of Health   Financial Resource Strain: Not on file  Food Insecurity: Not on file  Transportation Needs: Not on file  Physical Activity: Not on file  Stress: Not on file  Social Connections: Not on file   Intimate Partner Violence: Not on file    No Known Allergies  Prior to Admission medications   Medication Sig Start Date End Date Taking? Authorizing Provider  Prenatal Vit-Fe Fumarate-FA (MULTIVITAMIN-PRENATAL) 27-0.8 MG TABS tablet Take 1 tablet by mouth daily at 12 noon.   Yes [provider]  mupirocin nasal ointment (BACTROBAN) 2 % Place 1 application into the nose 2 (two) times daily. Use one-half of tube in each nostril twice daily for five (5) days. After application, press sides of nose together and gently massage. 05/20/20   Conard Novak, MD    Review of Systems  Constitutional: Negative.   HENT: Negative.   Eyes: Negative.   Respiratory: Negative.   Cardiovascular: Negative.   Gastrointestinal: Negative.   Genitourinary: Negative.   Musculoskeletal: Negative.   Skin: Negative.   Neurological: Negative.   Psychiatric/Behavioral: Negative.      Physical Exam BP 112/70   Ht 5\' 2"  (1.575 m)   BMI 29.59 kg/m  No LMP recorded. Physical Exam Constitutional:      General: She is not in acute distress.    Appearance: Normal appearance.  Genitourinary:  Breasts:     Right: No swelling, bleeding, inverted nipple, mass, nipple discharge, skin change or tenderness.     Left: No swelling, bleeding, inverted nipple, mass, nipple discharge, skin change or tenderness.  Breast exam comments: Right breast with mildly increased density of tissue in the area of the abscess. She was non-tender, no overlying erythema, no drainage. Marland Kitchen   HENT:     Head: Normocephalic and atraumatic.  Eyes:     General: No scleral icterus.    Conjunctiva/sclera: Conjunctivae normal.  Chest:    Neurological:     General: No focal deficit present.     Mental Status: She is alert and oriented to person, place, and time.     Cranial Nerves: No cranial nerve deficit.  Psychiatric:        Mood and Affect: Mood normal.        Behavior: Behavior normal.        Judgment: Judgment  normal.     Female chaperone present for pelvic and breast  portions of the physical exam  Assessment: 30 y.o. G31P1001 female here for  1. Breast pain, right   2. MRSA (methicillin resistant Staphylococcus aureus) colonization      Plan: Problem List Items Addressed This Visit   None   Visit Diagnoses    Breast pain, right    -  Primary   MRSA (methicillin resistant Staphylococcus aureus) colonization         No evidence of infection today. She sees general surgery in two days. They are moving back to [place] on 3/5.  So, she is wanting to make sure she is as safe as possible.  I will provider her a "just in case" antibiotic. I emphasized that this should be used only if she has tenderness, redness, or any other concerning symptoms.   She also wanted advice on birth control.  We discussed the various methods. She stated that she wanted an IUD, but wanted to wait a couple of days to have it placed.  A total of 20 minutes were spent face-to-face with the patient as well as preparation, review, communication, and documentation during this encounter.    Thomasene Mohair, MD 06/06/2020 1:17 PM

## 2020-06-08 ENCOUNTER — Other Ambulatory Visit: Payer: Self-pay

## 2020-06-08 ENCOUNTER — Encounter: Payer: Self-pay | Admitting: Surgery

## 2020-06-08 ENCOUNTER — Ambulatory Visit (INDEPENDENT_AMBULATORY_CARE_PROVIDER_SITE_OTHER): Payer: 59 | Admitting: Surgery

## 2020-06-08 VITALS — BP 110/80 | HR 76 | Temp 97.7°F | Ht 62.0 in | Wt 160.8 lb

## 2020-06-08 DIAGNOSIS — N611 Abscess of the breast and nipple: Secondary | ICD-10-CM

## 2020-06-08 DIAGNOSIS — Z09 Encounter for follow-up examination after completed treatment for conditions other than malignant neoplasm: Secondary | ICD-10-CM

## 2020-06-08 NOTE — Patient Instructions (Addendum)
The scar tissue should continue to get softer. If it does not get any better in 2 months, ask your OB/GYN to get a breast ultrasound in [place]. You can try Vitamin E to reduce the scarring. If you have any concerns or questions, please feel free to call our office.

## 2020-06-08 NOTE — Progress Notes (Signed)
06/08/2020  HPI: Patricia Ashley is a 30 y.o. female s/p I&D of right breast abscess, with two penrose drains placed given the large size of the abscess cavity.  She presents today for follow up.  Her drains are already out.  She reports no worsening pain but feels that there is a lump in the area where the abscess was.  She also reports decreased milk production in the right breast.  Vital signs: BP 110/80   Pulse 76   Temp 97.7 F (36.5 C) (Oral)   Ht 5\' 2"  (1.575 m)   Wt 160 lb 12.8 oz (72.9 kg)   SpO2 97%   BMI 29.41 kg/m    Physical Exam: Constitutional: No acute distress Breast:  Right breast s/p I&D with three incisions healing well, without any drainage or breakdown or evidence of infection.  The skin is softer now without the induration that was palpable on the last visit.  There's an area of firmness in the posterior aspect of the breast, in the area of the prior abscess cavity.  This I think is scar tissue rather than recurrence, as there is no tenderness to palpation and no overlying erythema.  Assessment/Plan: This is a 30 y.o. female s/p I&D of right breast abscess.  --Discussed with the patient that the lump she is feeling is most likely scar tissue from her surgery.  The abscess cavity was quite large.  I do not think this is recurrence of her infection.  The overlying skin is softer and without any erythema or tenderness.  I think this will continue to improve.  Recommended that if after two months the area has not changed, may get a repeat ultrasound to evaluate for any fluid collection that may need aspiration. --No further antibiotics are needed at this point. --Discussed with her that the abscess cavity and the I&D procedure may have caused disruption of the surrounding milk ducts, but do not think that she's leaking any breast milk into there breast tissue.  No fluctuance is palpable. --Follow up as needed.   37, MD [facility]

## 2021-10-28 IMAGING — US US OB COMP +14 WK
1 series · 15 of 28 positions shown · non-contrast
Comparison: none

CLINICAL DATA: Third trimester pregnancy. Evaluate fetal growth and
amniotic fluid.

EXAM:
OBSTETRICAL ULTRASOUND >14 WKS

[Series 1: us ob follow up · 71 acquisitions, 15 frames shown]
[im 1/71]
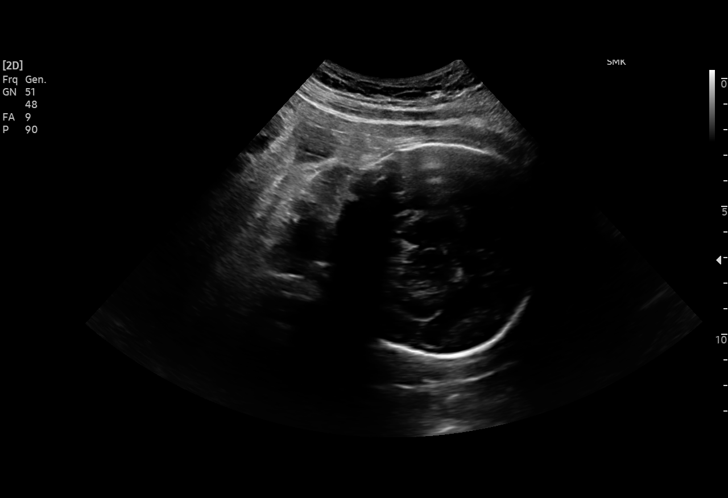
[im 6/71]
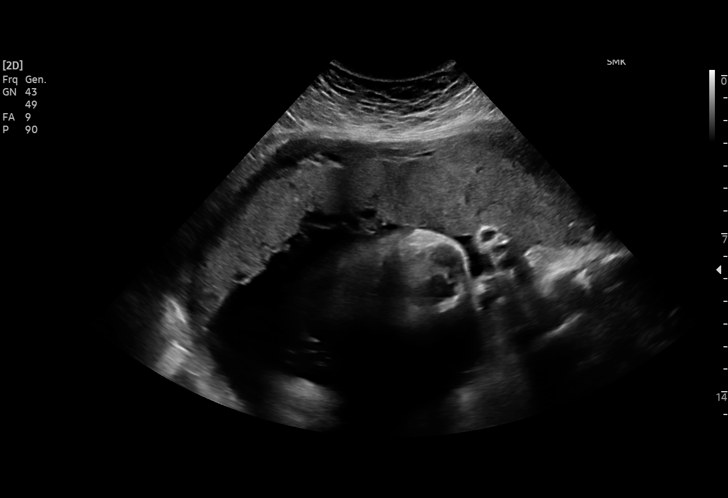
[im 11/71]
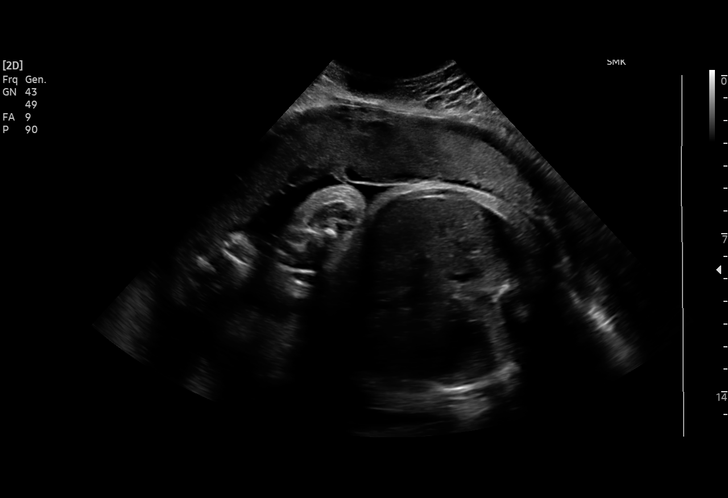
[im 16/71]
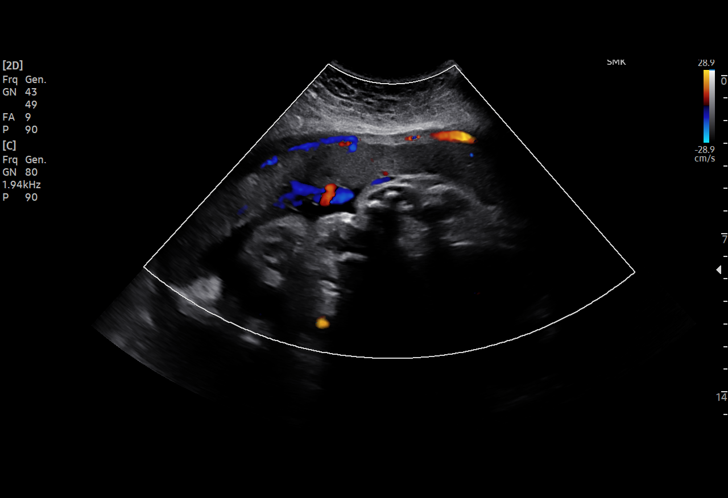
[im 21/71]
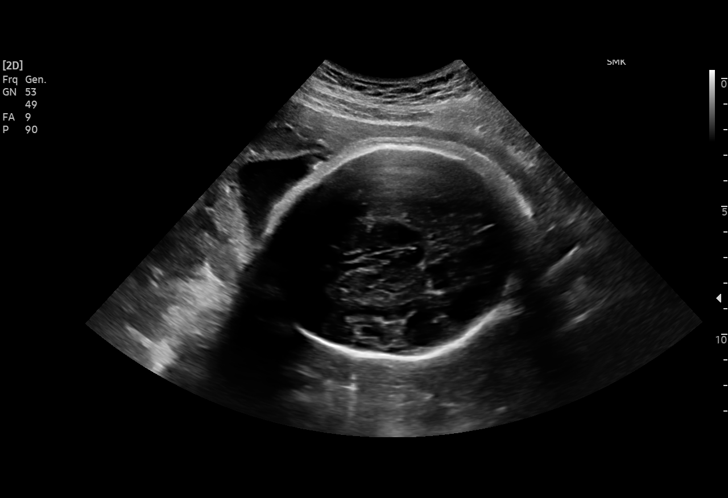
[im 26/71]
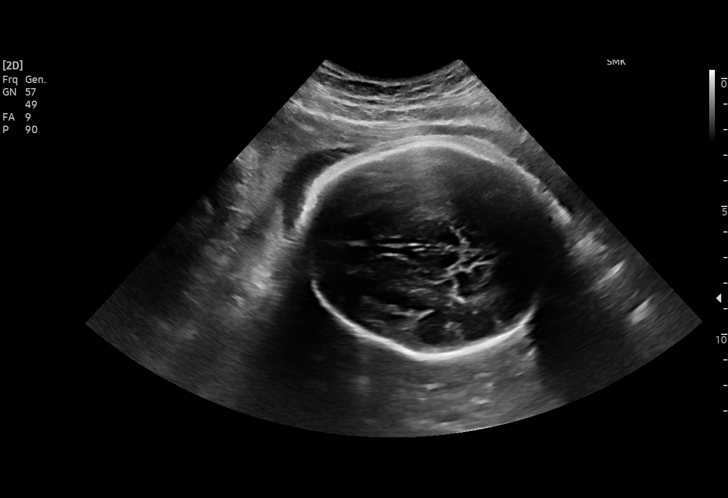
[im 32/71]
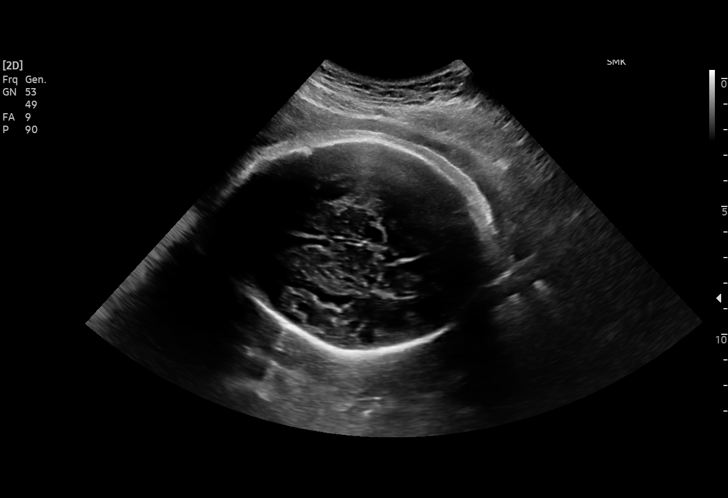
[im 37/71]
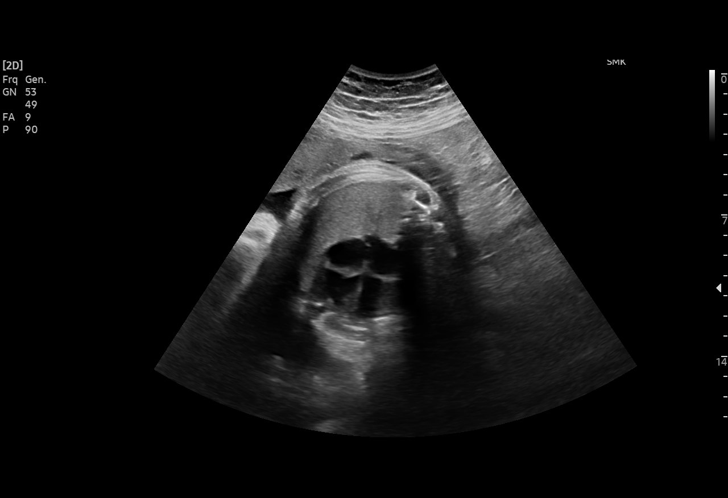
[im 39/71]
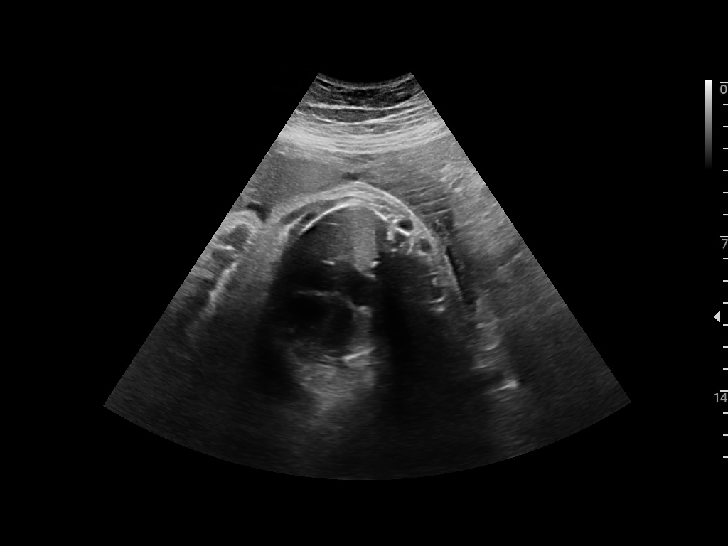
[im 45/71]
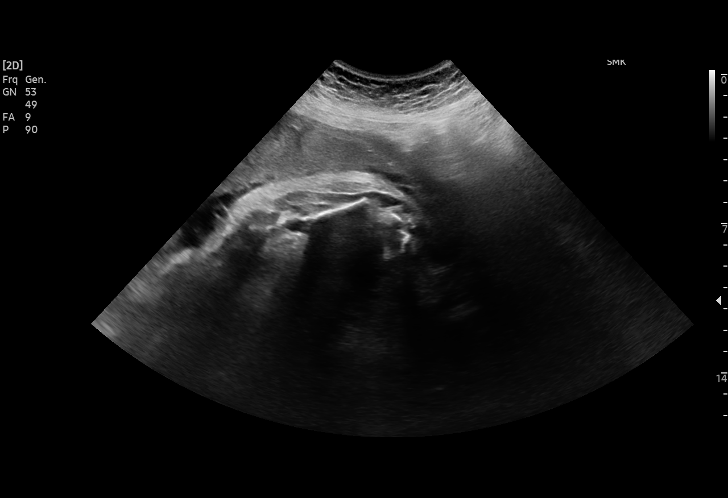
[im 50/71]
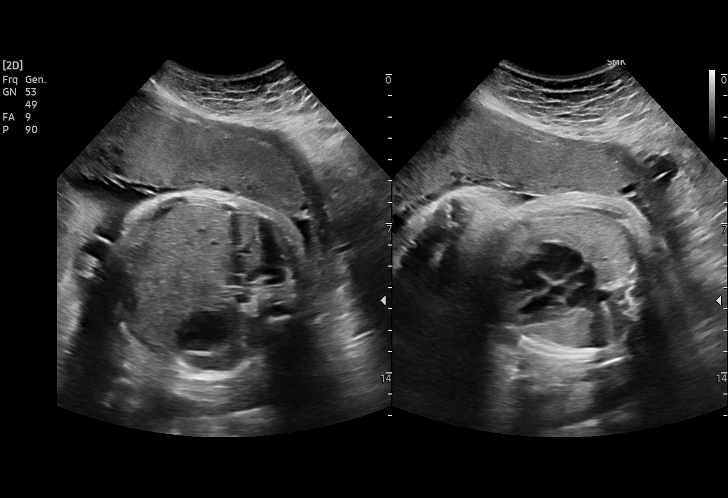
[im 55/71]
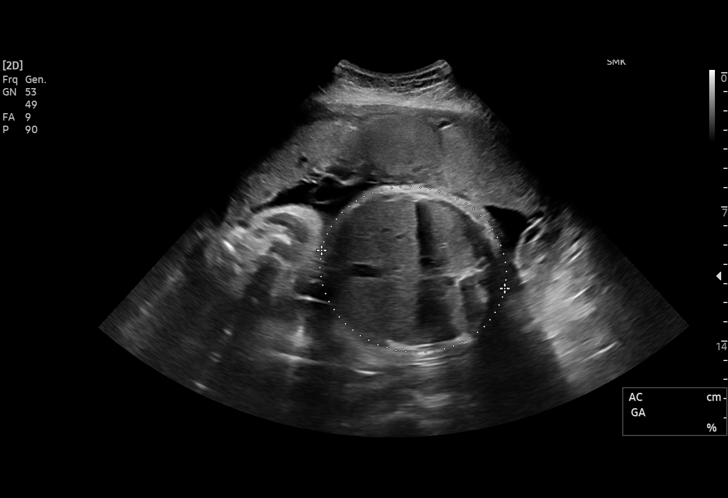
[im 60/71]
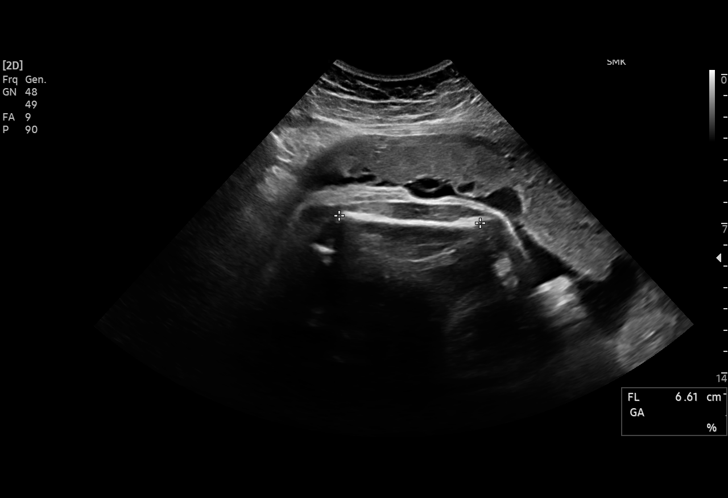
[im 65/71]
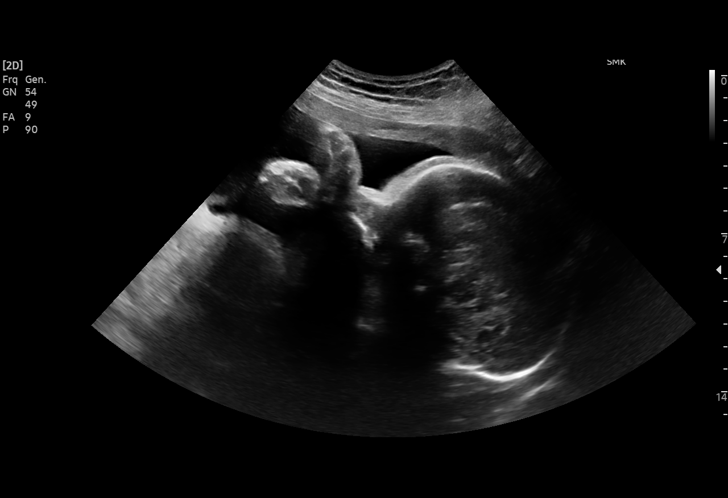
[im 71/71]
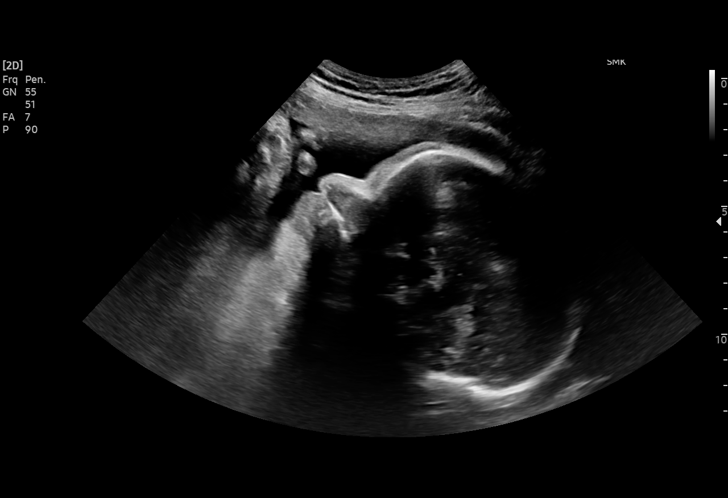

[15 of 28 positions shown; findings below may reference images not displayed]

FINDINGS: Number of Fetuses: 1

Heart Rate:  140 bpm

Movement: Yes

Presentation: Cephalic

Previa: No

Placental Location: Anterior

Amniotic Fluid (Subjective): Within normal limits

Amniotic Fluid (Objective):

Vertical pocket = 6.1cm

AFI = 16.7 cm (5%ile= 8.3 cm, 95%= 27.4 cm for 33 wks)

FETAL BIOMETRY

BPD: 8.4cm 33w 4d

HC:   29.5cm 32w 4d

AC:   29.0cm 33w 0d

FL:   6.6cm 34w 1d

Current Mean GA: 33w 5d US EDC: 04/12/2020

Assigned GA:  33w 1d Assigned EDC: 04/17/2020

Estimated Fetal Weight:  2,172g 60%ile

FETAL ANATOMY

Lateral Ventricles: Appears normal

Thalami/CSP: Appears normal

Posterior Fossa:  Appears normal

Nuchal Region: Not visualized   NFT= N/A > 20 WKS

Upper Lip: Not visualized

Spine: Appears normal

4 Chamber Heart on Left: Appears normal

LVOT: Not visualized

RVOT: Not visualized

Stomach on Left: Appears normal

3 Vessel Cord: Appears normal

Cord Insertion site: Appears normal

Kidneys: Appears normal

Bladder: Appears normal

Extremities: Not well visualized

Technically difficult due to: Advanced gestational age and fetal
position

Maternal Findings:

Cervix:  3.9 cm TA
IMPRESSION: Assigned GA currently 33 weeks 1 day. Appropriate fetal growth, with
EFW currently at 60 %ile.

Amniotic fluid volume within normal limits, with AFI of 16.7 cm.

## 2022-01-01 IMAGING — US US BREAST*R* LIMITED INC AXILLA
1 series · 3 of 3 positions shown · non-contrast
Comparison: None.

CLINICAL DATA: 29-year-old female presenting with clinical concern
for right breast abscess. Patient is recently postpartum and
admitted to the hospital with a right breast infection/mastitis.

EXAM:
ULTRASOUND OF THE RIGHT BREAST

[Series 1: us breast*right* limited inc axilla · 0.07mm/px · 3 of 3 slices shown]
[im 1/3]
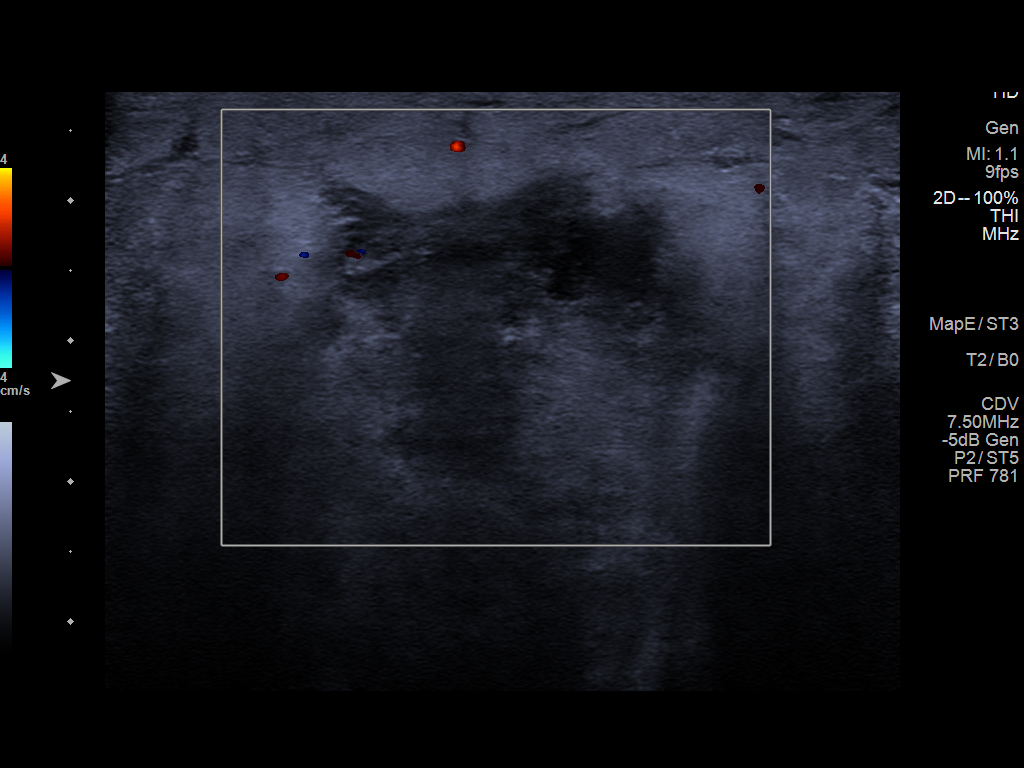
[im 2/3]
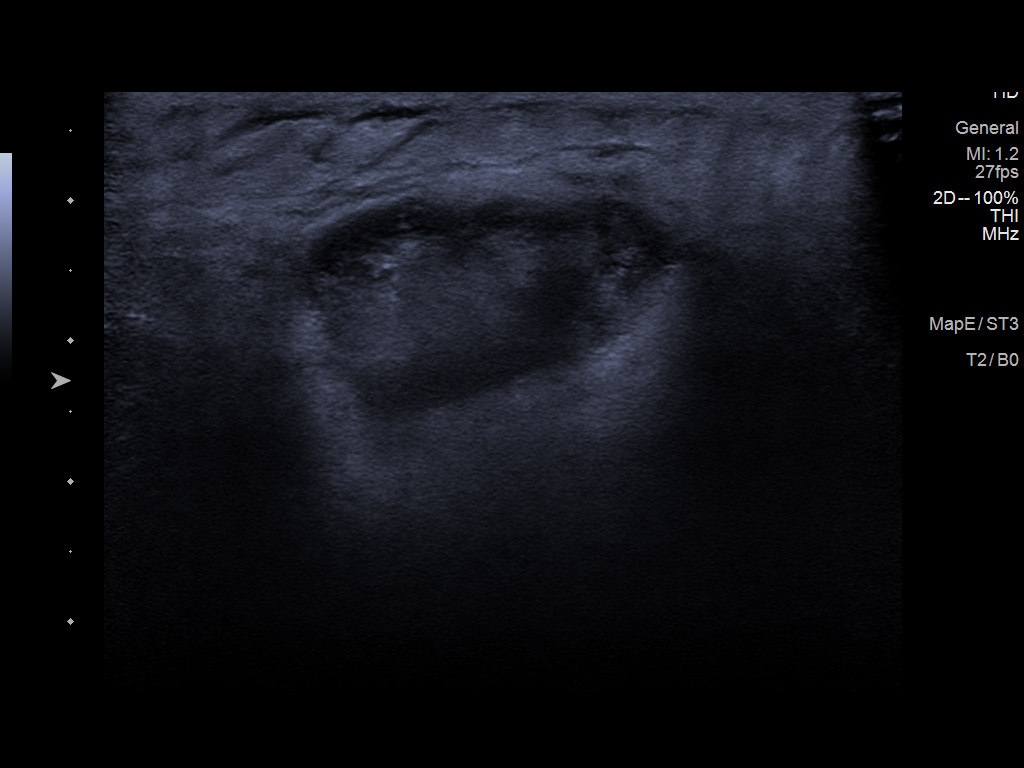
[im 3/3]
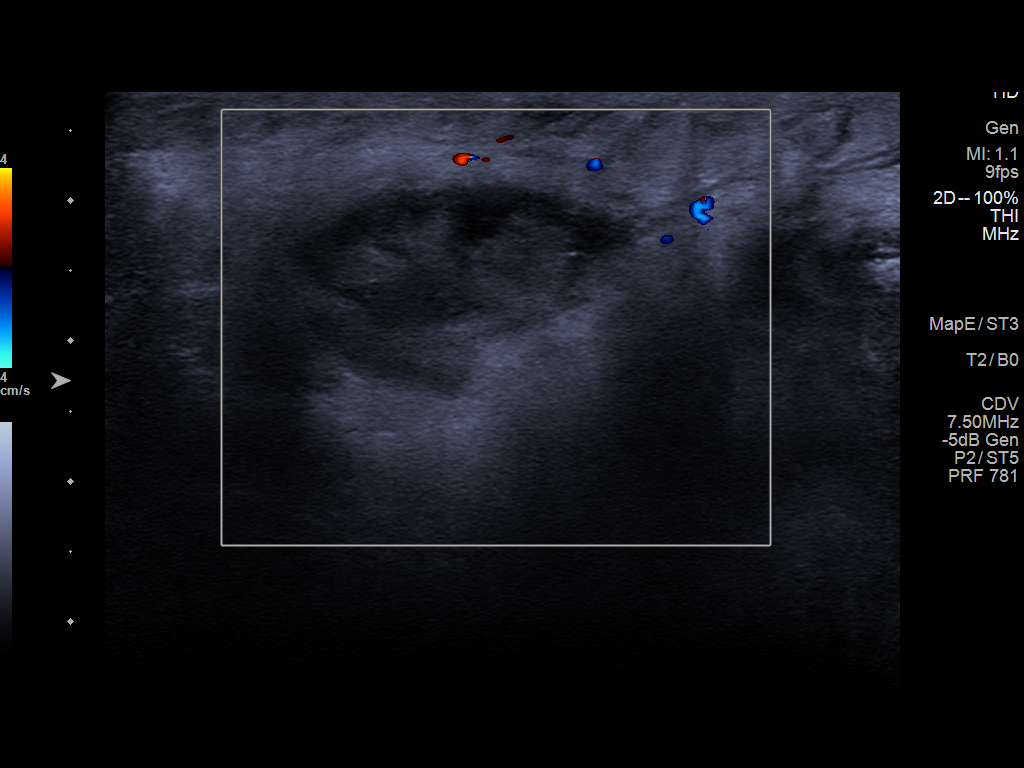

[3 of 3 positions shown; findings below may reference images not displayed]

FINDINGS: On physical exam, there is a hard palpable mass with skin thickening
and erythema in the medial aspect of the right breast.

Targeted ultrasound is performed in the right breast at 3 o'clock 9
cm from the nipple demonstrating an irregular mixed density
collection measuring at least 3.9 x 2.3 x 2.9 cm, consistent with an
abscess. There is overlying skin thickening and adjacent echogenic
tissue compatible with inflammatory changes. There is a second
smaller nearby collection at 3 o'clock 12 cm from the nipple
measuring 2.6 x 1.3 x 2.6 cm.
IMPRESSION: Left breast findings compatible with cellulitis/mastitis and
abscesses. There are two collections in the medial left breast, the
larger measures at least 3.9 cm.

RECOMMENDATION:
1. Ultrasound-guided core needle aspiration of the left breast. This
will be performed same day.

2.  Antibiotic management per patient hospital team.

I have discussed the findings and recommendations with the patient
who agrees to proceed with aspiration.

BI-RADS CATEGORY  2: Benign.

## 2022-01-03 IMAGING — US US BREAST*R* LIMITED INC AXILLA
1 series · 8 of 8 positions shown · non-contrast
Comparison: Previous exam(s).

CLINICAL DATA: Follow-up of right breast abscess.

Status post aspiration revealing MRSA colonization.
EXAM:
ULTRASOUND OF THE RIGHT BREAST

[Series 1: us breast*right* limited inc axilla · 0.09mm/px · 8 of 8 slices shown]
[im 1/8]
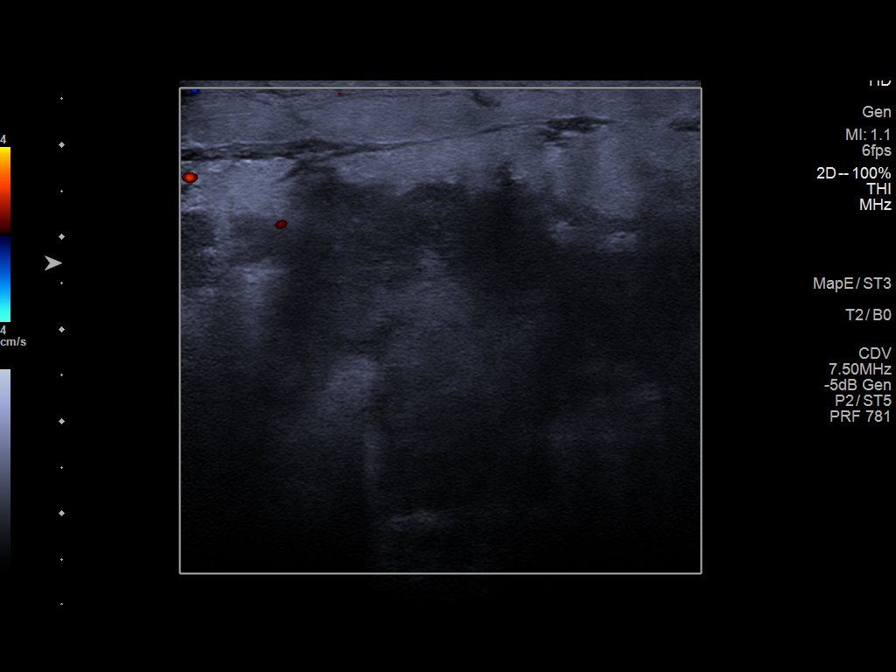
[im 2/8]
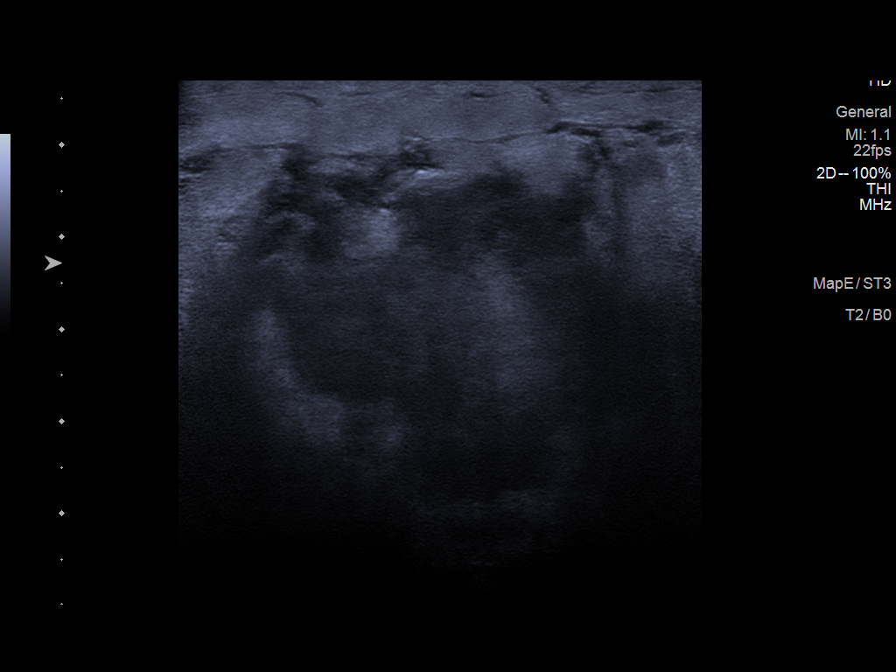
[im 3/8]
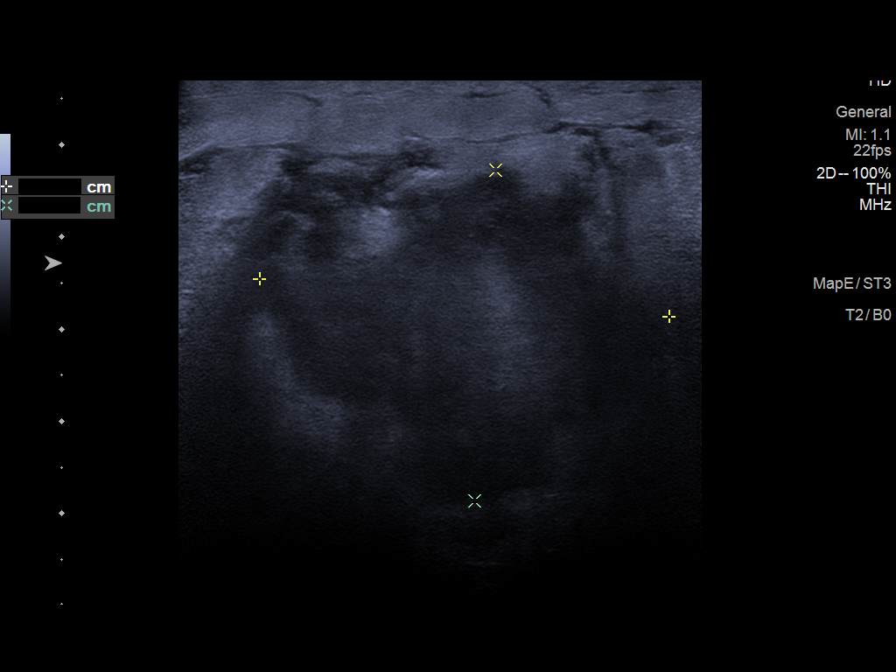
[im 4/8]
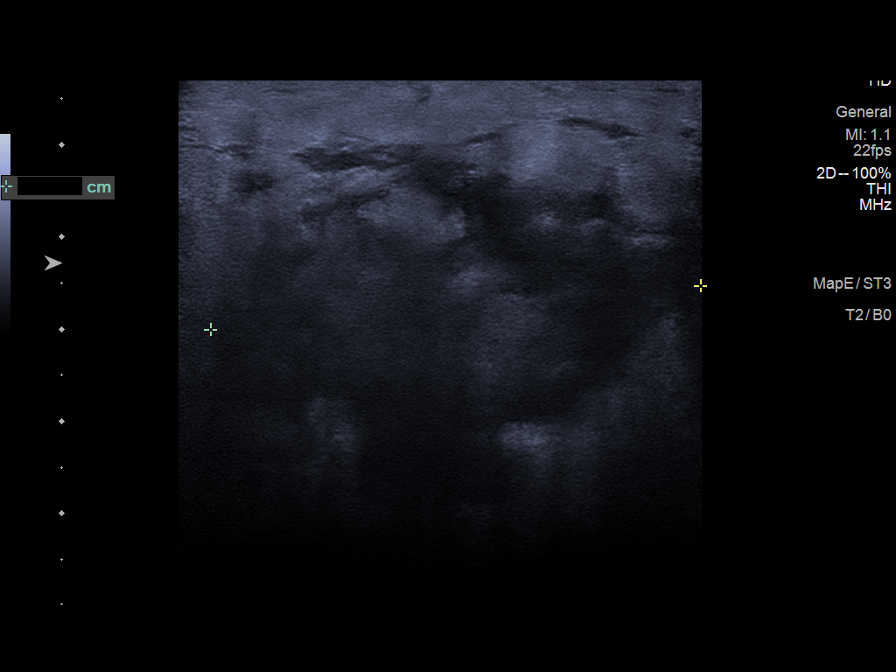
[im 5/8]
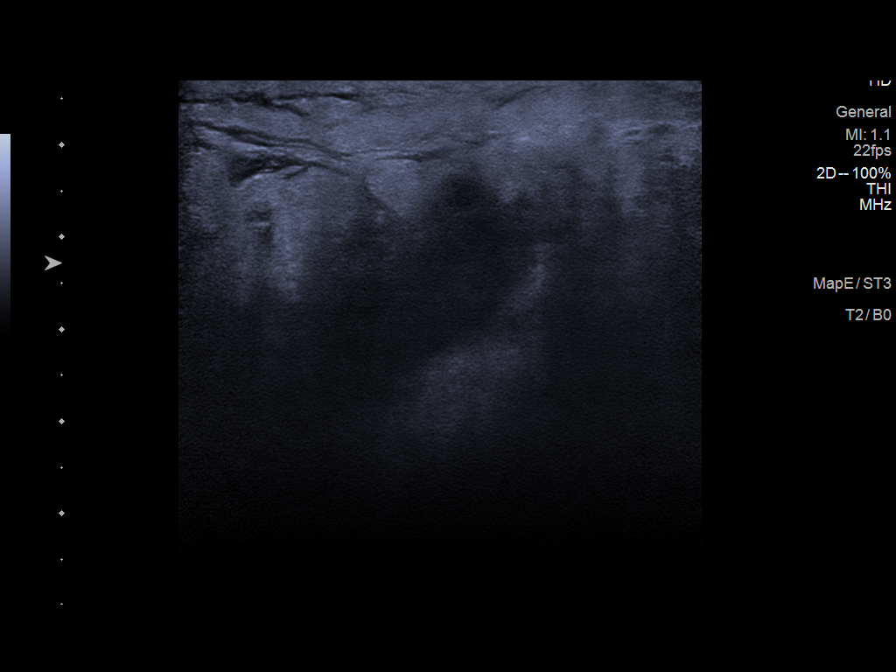
[im 6/8]
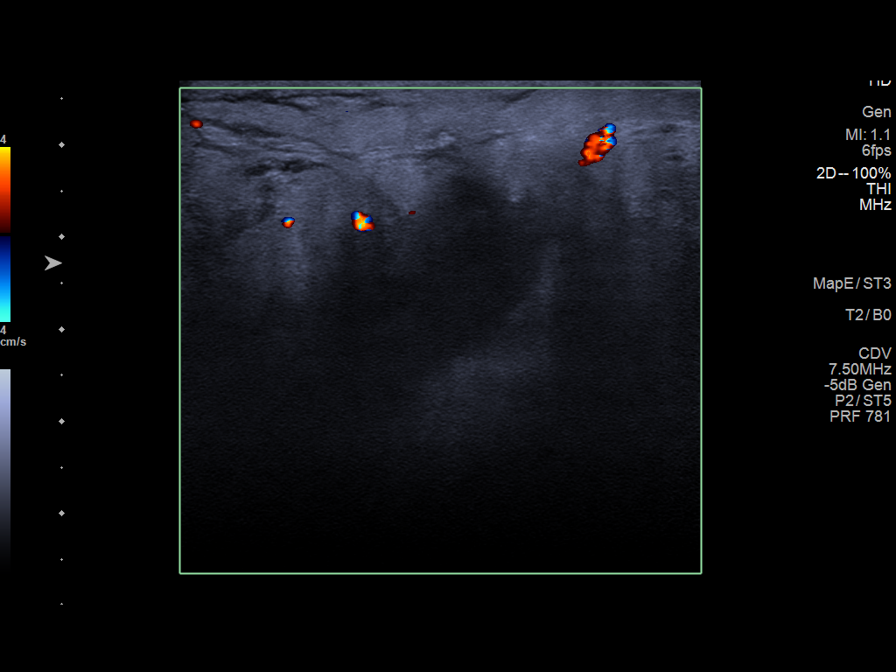
[im 7/8]
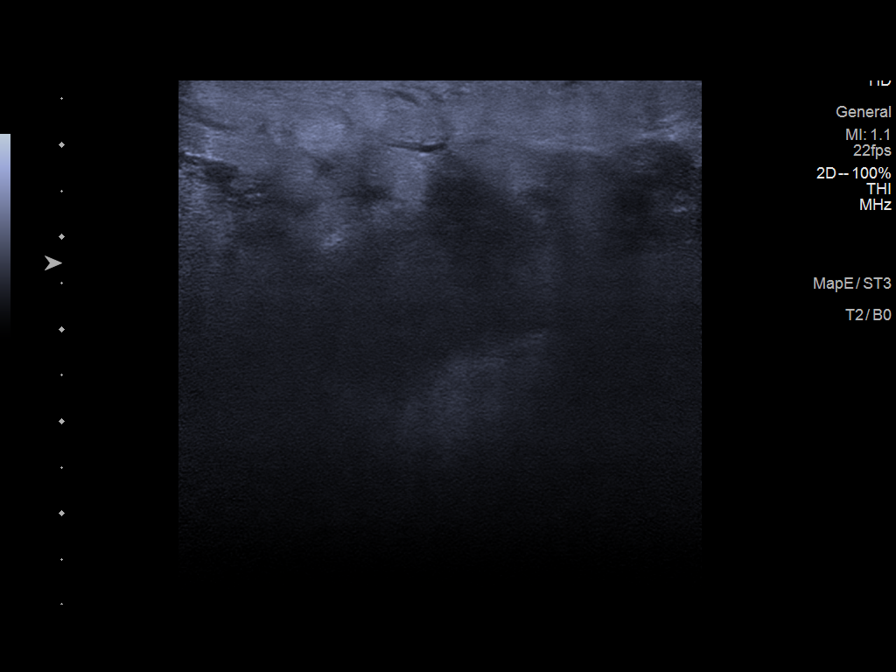
[im 8/8]
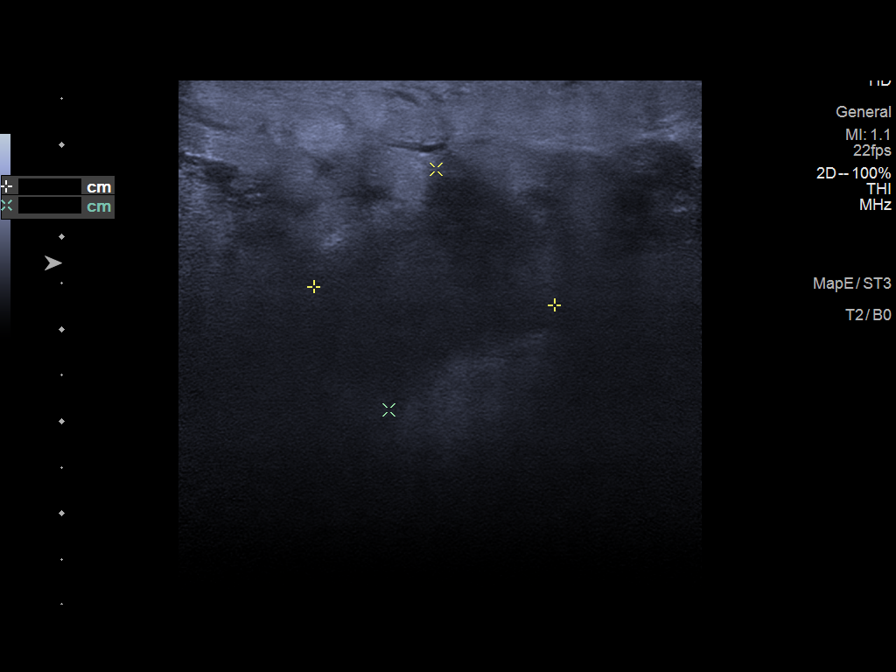

[8 of 8 positions shown; findings below may reference images not displayed]

FINDINGS: Targeted right breast ultrasound is performed, showing persistent
irregular heterogeneous fluid collection centered in the right 3
o'clock breast 9 cm from the nipple. The collection today measures
4.5 x 3.6 x 5.3 cm, which represents enlargement from the prior
ultrasound dated May 04, 2020. Breast edema and hyperemia is
noted. There is a marked persistent overlying skin thickening. A
second pocket of complex fluid in the right breast 3 o'clock 12 cm
from the nipple has also enlarged measuring 2.4 x 2.6 x 2.7 cm.
IMPRESSION: Enlarging right breast abscesses with marked overlying skin
thickening.

RECOMMENDATION:
Surgical consultation for treatment.

I have discussed the findings and recommendations with the patient.
If applicable, a reminder letter will be sent to the patient
regarding the next appointment.

BI-RADS CATEGORY  2: Benign.
